# Patient Record
Sex: Male | Born: 1965
Health system: Southern US, Community
[De-identification: ages and names within clinical notes are randomized; demographics above are authoritative.]

## PROBLEM LIST (undated history)

## (undated) DIAGNOSIS — F32A Depression, unspecified: Secondary | ICD-10-CM

## (undated) DIAGNOSIS — F329 Major depressive disorder, single episode, unspecified: Secondary | ICD-10-CM

## (undated) DIAGNOSIS — F419 Anxiety disorder, unspecified: Secondary | ICD-10-CM

## (undated) HISTORY — PX: HERNIA REPAIR: SHX51

## (undated) HISTORY — DX: Depression, unspecified: F32.A

## (undated) HISTORY — DX: Anxiety disorder, unspecified: F41.9

---
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
## 2016-04-01 LAB — HM HEPATITIS C SCREENING LAB: HM Hepatitis Screen: NEGATIVE

## 2016-05-13 ENCOUNTER — Ambulatory Visit (HOSPITAL_COMMUNITY)
Admission: EM | Admit: 2016-05-13 | Discharge: 2016-05-13 | Disposition: A | Discharge status: Home / Self Care | Payer: Worker's Compensation | Attending: Family Medicine | Admitting: Family Medicine

## 2016-05-13 ENCOUNTER — Other Ambulatory Visit: Payer: Self-pay

## 2016-05-13 ENCOUNTER — Encounter (HOSPITAL_COMMUNITY): Payer: Self-pay | Admitting: Emergency Medicine

## 2016-05-13 DIAGNOSIS — R509 Fever, unspecified: Secondary | ICD-10-CM | POA: Diagnosis present

## 2016-05-13 DIAGNOSIS — A7740 Ehrlichiosis, unspecified: Secondary | ICD-10-CM | POA: Insufficient documentation

## 2016-05-13 DIAGNOSIS — F17210 Nicotine dependence, cigarettes, uncomplicated: Secondary | ICD-10-CM | POA: Insufficient documentation

## 2016-05-13 LAB — POCT I-STAT, CHEM 8
BUN: 21 mg/dL — AB (ref 6–20)
CALCIUM ION: 1.08 mmol/L — AB (ref 1.12–1.23)
CHLORIDE: 99 mmol/L — AB (ref 101–111)
CREATININE: 0.8 mg/dL (ref 0.61–1.24)
GLUCOSE: 119 mg/dL — AB (ref 65–99)
HCT: 51 % (ref 39.0–52.0)
Hemoglobin: 17.3 g/dL — ABNORMAL HIGH (ref 13.0–17.0)
Potassium: 4 mmol/L (ref 3.5–5.1)
SODIUM: 135 mmol/L (ref 135–145)
TCO2: 27 mmol/L (ref 0–100)

## 2016-05-13 NOTE — ED Notes (Signed)
The patient presented to the [facility] with a complaint of a fever and pain secondary to a possible insect bite that occurred about a week and a half ago. The patient stated that he went to the city's medical services and was prescribed doxycycline on 05/11/2016. The patient stated that he continues to hurt and spike a fever off and on.

## 2016-05-13 NOTE — Discharge Instructions (Signed)
Ehrlichiosis and Anaplasmosis  Ehrlichiosis and anaplasmosis are diseases people get from ticks. Symptoms often show up 1 week or more after a tick bite. Symptoms may include:  Fever.  Headache.  Chills or shaking.  Tiredness (fatigue).  Muscle pain.  Feeling sick to your stomach (nauseous).  Loss of appetite.  Throwing up (vomiting).  Watery poop (diarrhea). HOME CARE   Take your medicine (antibiotics) as told. Finish it even if you start to feel better.  Avoid ticks by following these rules:  Wear light-colored clothing to help see ticks more easily.  Wear long pants tucked into socks. Wear long-sleeved shirts tucked into pants.  Wear closed shoes or boots.  Use insect repellent. Spray clothes with insect repellent. Follow the directions carefully.  Wear a hat and keep long hair pulled back.  Stay on cleared trails when possible.  Take off your clothes after leaving tick areas. Wash them to get rid of any unseen ticks.  Check yourself, your children, and your pets from head to toe for ticks.  Shower and shampoo after leaving tick areas. GET HELP RIGHT AWAY IF:   You have a fever.  You have a headache.  You feel very tired.  You have muscle pain.  You feel sick to your stomach.  You throw up.  You have watery poop. MAKE SURE YOU:  Understand these instructions.  Will watch your condition.  Will get help right away if you are not doing well or get worse.   This information is not intended to replace advice given to you by your health care provider. Make sure you discuss any questions you have with your health care provider.   Document Released: 10/04/2009 Document Revised: 02/29/2012 Document Reviewed: 07/10/2015 Elsevier Interactive Patient Education [employer].

## 2016-05-14 NOTE — ED Provider Notes (Signed)
CSN: [id number]     Arrival date & time 05/13/16  1713 History   First MD Initiated Contact with Patient 05/13/16 1745     Chief Complaint  Patient presents with  . Insect Bite  . Fever   (Consider location/radiation/quality/duration/timing/severity/associated sxs/prior Treatment) HPI Pt presents with sore throat, body aches, fever, chills for 1-2 days Home treatment has been Doxycycline without much relief of symptoms Fever is improved for short periods of time with OTC antipyretics. Pain score is 4 mostly from coughing and body aches Taking fluids, no appetite  Wife states he has been lying on couch, not much activity.  Has been exposed to ticks, and was seen by city health service.  Denies: CP, SOB, vomiting, diarrhea, rash.    History reviewed. No pertinent past medical history. Past Surgical History  Procedure Laterality Date  . Hernia repair     History reviewed. No pertinent family history. Social History  Substance Use Topics  . Smoking status: Current Every Day Smoker -- 1.00 packs/day for 30 years    Types: Cigarettes  . Smokeless tobacco: None  . Alcohol Use: 2.4 oz/week    4 Cans of beer per week     Comment: Daily    Review of Systems  Denies: HEADACHE, NAUSEA, ABDOMINAL PAIN, CHEST PAIN, CONGESTION, DYSURIA, SHORTNESS OF BREATH  Allergies  Review of patient's allergies indicates no known allergies.  Home Medications   Prior to Admission medications   Medication Sig Start Date End Date Taking? Authorizing Provider  doxycycline (DORYX) 100 MG EC tablet Take by mouth 2 (two) times daily.   Yes Historical Provider, MD   Meds Ordered and Administered this Visit  Medications - No data to display  BP 111/73 mmHg  Pulse 82  Temp(Src) 97.9 F (36.6 C) (Oral)  Resp 18  SpO2 96% No data found.   Physical Exam NURSES NOTES AND VITAL SIGNS REVIEWED. CONSTITUTIONAL: Well developed, well nourished, no acute distress HEENT: normocephalic, atraumatic EYES:  Conjunctiva normal NECK:normal ROM, supple, no adenopathy PULMONARY:No respiratory distress, normal effort ABDOMINAL: Soft, ND, NT BS+, No CVAT MUSCULOSKELETAL: Normal ROM of all extremities,  SKIN: warm and dry without rash PSYCHIATRIC: Mood and affect, behavior are normal   ED Course  Procedures (including critical care time)  Labs Review Labs Reviewed  POCT I-STAT, CHEM 8 - Abnormal; Notable for the following:    Chloride 99 (*)    BUN 21 (*)    Glucose, Bld 119 (*)    Calcium, Ion 1.08 (*)    Hemoglobin 17.3 (*)    All other components within normal limits  B. BURGDORFI ANTIBODIES    Imaging Review No results found.   Visual Acuity Review  Right Eye Distance:   Left Eye Distance:   Bilateral Distance:    Right Eye Near:   Left Eye Near:    Bilateral Near:         MDM   1. Ehrlichiosis, unspecified   Review of blood tests in a couple of days with patient Telephone consultation with Dr. Johnnye Sima from ID, for advice.  Suggested titers for lyme, but most likely Ehrlichiosis, as lyme is not endemic to this area. Pt and wife were happy with this discussion  Patient is reassured that there are no issues that require transfer to higher level of care at this time. Patient is advised to continue home symptomatic treatment. Patient is advised that if there are new or worsening symptoms to attend the emergency department, contact primary care  provider, or return to [facility]. Instructions of care provided discharged home in stable condition.    THIS NOTE WAS GENERATED USING A VOICE RECOGNITION SOFTWARE PROGRAM. ALL REASONABLE EFFORTS  WERE MADE TO PROOFREAD THIS DOCUMENT FOR ACCURACY.  I have verbally reviewed the discharge instructions with the patient. A printed AVS was given to the patient.  All questions were answered prior to discharge.      Konrad Felix, PA 05/14/16 1046

## 2016-05-15 LAB — B. BURGDORFI ANTIBODIES

## 2016-05-18 ENCOUNTER — Telehealth (HOSPITAL_COMMUNITY): Payer: Self-pay | Admitting: Emergency Medicine

## 2016-05-18 NOTE — ED Notes (Signed)
Pt called back Pt ID'd properly... Reports feeling better and sx have subsided  Per Dr. Bridgett Larsson,  Notes Recorded by Melony Overly, MD on 05/15/2016 at 3:51 PM Please notify patient of negative Lyme titers. He should complete course of doxycycline for presumed Ehrlichiosis.  Adv pt if sx are not getting better to return  Pt verb understanding

## 2016-05-18 NOTE — ED Notes (Signed)
LM on pt's VM [phone] Need to give lab results from recent visit on 5/24 Also let pt know labs can be obtained from [facility]  Per Dr. Bridgett Larsson,  Notes Recorded by Melony Overly, MD on 05/15/2016 at 3:51 PM Please notify patient of negative Lyme titers. He should complete course of doxycycline for presumed Ehrlichiosis.

## 2016-05-25 LAB — MISC LABCORP TEST (SEND OUT): Labcorp test code: 138412

## 2016-07-01 LAB — HIV ANTIBODY (ROUTINE TESTING W REFLEX): HIV 1&2 Ab, 4th Generation: NEGATIVE

## 2016-07-03 ENCOUNTER — Ambulatory Visit (INDEPENDENT_AMBULATORY_CARE_PROVIDER_SITE_OTHER): Payer: Commercial Managed Care - HMO | Admitting: Internal Medicine

## 2016-07-03 ENCOUNTER — Encounter: Payer: Self-pay | Admitting: Internal Medicine

## 2016-07-03 VITALS — BP 118/80 | HR 88 | Temp 98.2°F | Resp 16 | Ht 69.0 in | Wt 152.0 lb

## 2016-07-03 DIAGNOSIS — Z13220 Encounter for screening for lipoid disorders: Secondary | ICD-10-CM

## 2016-07-03 DIAGNOSIS — Z125 Encounter for screening for malignant neoplasm of prostate: Secondary | ICD-10-CM

## 2016-07-03 DIAGNOSIS — Z0000 Encounter for general adult medical examination without abnormal findings: Secondary | ICD-10-CM | POA: Diagnosis not present

## 2016-07-03 DIAGNOSIS — Z136 Encounter for screening for cardiovascular disorders: Secondary | ICD-10-CM

## 2016-07-03 DIAGNOSIS — Z131 Encounter for screening for diabetes mellitus: Secondary | ICD-10-CM

## 2016-07-03 DIAGNOSIS — Z130 Encounter for screening for diseases of the blood and blood-forming organs and certain disorders involving the immune mechanism: Secondary | ICD-10-CM

## 2016-07-03 DIAGNOSIS — Z1329 Encounter for screening for other suspected endocrine disorder: Secondary | ICD-10-CM

## 2016-07-03 DIAGNOSIS — Z23 Encounter for immunization: Secondary | ICD-10-CM

## 2016-07-03 DIAGNOSIS — Z1389 Encounter for screening for other disorder: Secondary | ICD-10-CM

## 2016-07-03 DIAGNOSIS — K4090 Unilateral inguinal hernia, without obstruction or gangrene, not specified as recurrent: Secondary | ICD-10-CM

## 2016-07-03 DIAGNOSIS — E559 Vitamin D deficiency, unspecified: Secondary | ICD-10-CM

## 2016-07-03 DIAGNOSIS — Z720 Tobacco use: Secondary | ICD-10-CM

## 2016-07-03 LAB — BASIC METABOLIC PANEL WITH GFR
BUN: 9 mg/dL (ref 7–25)
CALCIUM: 9.4 mg/dL (ref 8.6–10.3)
CO2: 26 mmol/L (ref 20–31)
Chloride: 102 mmol/L (ref 98–110)
Creat: 0.74 mg/dL (ref 0.60–1.35)
GLUCOSE: 97 mg/dL (ref 65–99)
Potassium: 4.2 mmol/L (ref 3.5–5.3)
Sodium: 141 mmol/L (ref 135–146)

## 2016-07-03 LAB — LIPID PANEL
CHOLESTEROL: 211 mg/dL — AB (ref 125–200)
HDL: 69 mg/dL (ref >=40)
LDL Cholesterol: 130 mg/dL — ABNORMAL HIGH (ref <130)
TRIGLYCERIDES: 60 mg/dL (ref <150)
Total CHOL/HDL Ratio: 3.1 Ratio (ref <=5.0)
VLDL: 12 mg/dL (ref <30)

## 2016-07-03 LAB — CBC WITH DIFFERENTIAL/PLATELET
Basophils Absolute: 0 cells/uL (ref 0–200)
Basophils Relative: 0 %
Eosinophils Absolute: 288 cells/uL (ref 15–500)
Eosinophils Relative: 4 %
HEMATOCRIT: 48.2 % (ref 38.5–50.0)
Hemoglobin: 16.6 g/dL (ref 13.2–17.1)
LYMPHS PCT: 31 %
Lymphs Abs: 2232 cells/uL (ref 850–3900)
MCH: 32.4 pg (ref 27.0–33.0)
MCHC: 34.4 g/dL (ref 32.0–36.0)
MCV: 94 fL (ref 80.0–100.0)
MONO ABS: 432 {cells}/uL (ref 200–950)
MONOS PCT: 6 %
MPV: 10.1 fL (ref 7.5–12.5)
NEUTROS PCT: 59 %
Neutro Abs: 4248 cells/uL (ref 1500–7800)
PLATELETS: 140 10*3/uL (ref 140–400)
RBC: 5.13 MIL/uL (ref 4.20–5.80)
RDW: 14.5 % (ref 11.0–15.0)
WBC: 7.2 10*3/uL (ref 3.8–10.8)

## 2016-07-03 LAB — HEPATIC FUNCTION PANEL
ALBUMIN: 4.3 g/dL (ref 3.6–5.1)
ALT: 21 U/L (ref 9–46)
AST: 22 U/L (ref 10–40)
Alkaline Phosphatase: 74 U/L (ref 40–115)
BILIRUBIN INDIRECT: 0.5 mg/dL (ref 0.2–1.2)
Bilirubin, Direct: 0.1 mg/dL (ref <=0.2)
TOTAL PROTEIN: 7.2 g/dL (ref 6.1–8.1)
Total Bilirubin: 0.6 mg/dL (ref 0.2–1.2)

## 2016-07-03 LAB — HEMOGLOBIN A1C
Hgb A1c MFr Bld: 5.4 % (ref <5.7)
Mean Plasma Glucose: 108 mg/dL

## 2016-07-03 LAB — IRON AND TIBC
%SAT: 38 % (ref 15–60)
Iron: 113 ug/dL (ref 50–180)
TIBC: 297 ug/dL (ref 250–425)
UIBC: 184 ug/dL (ref 125–400)

## 2016-07-03 LAB — MAGNESIUM: Magnesium: 2 mg/dL (ref 1.5–2.5)

## 2016-07-03 LAB — VITAMIN B12: Vitamin B-12: 566 pg/mL (ref 200–1100)

## 2016-07-03 LAB — TSH: TSH: 0.97 m[IU]/L (ref 0.40–4.50)

## 2016-07-03 NOTE — Progress Notes (Signed)
Annual Screening Comprehensive Examination and NEW Patient Establishment   This very nice 50 y.o.male presents for complete physical and establishment as a new patient.  Patient has no major health issues.  He is a father to 3 kids.  He works for [employer].  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    Patient reports no complaints at this time.   He has a history of smoking.  He has 30 pack years of smoking.    He has a history of an inguinal hernia repair approximately 10 years ago.  He is getting some bulging on the opposite side.    He is unaware of any tetanus shots in the last 10 years  He has not had his eyes checked recently.  He does see a dentist.    Finally, patient has history of Vitamin D Deficiency and last vitamin D was No results found for: VD25OH.  Currently on supplementation     No current outpatient prescriptions on file prior to visit.   No current facility-administered medications on file prior to visit.    No Known Allergies  No past medical history on file.   There is no immunization history on file for this patient.  Past Surgical History  Procedure Laterality Date  . Hernia repair      No family history on file.  Social History   Social History  . Marital Status: Married    Spouse Name: N/A  . Number of Children: N/A  . Years of Education: N/A   Occupational History  . Not on file.   Social History Main Topics  . Smoking status: Current Every Day Smoker -- 1.00 packs/day for 30 years    Types: Cigarettes  . Smokeless tobacco: Not on file  . Alcohol Use: 2.4 oz/week    4 Cans of beer per week     Comment: Daily  . Drug Use: Not on file  . Sexual Activity: Not on file   Other Topics Concern  . Not on file   Social History Narrative   Review of Systems  HENT: Positive for congestion. Negative for ear pain, sore throat and tinnitus.   Respiratory: Negative for cough, shortness of breath and  wheezing.   Cardiovascular: Negative for chest pain, palpitations and leg swelling.  Gastrointestinal: Positive for diarrhea. Negative for heartburn, abdominal pain, constipation, blood in stool and melena.  Genitourinary: Negative.   Neurological: Negative for dizziness, sensory change, loss of consciousness and headaches.  Psychiatric/Behavioral: Positive for depression. The patient is not nervous/anxious and does not have insomnia.       Physical Exam  BP 118/80 mmHg  Pulse 88  Temp(Src) 98.2 F (36.8 C) (Temporal)  Resp 16  Ht 5\' 9"  (1.753 m)  Wt 152 lb (68.947 kg)  BMI 22.44 kg/m2  General Appearance: Well nourished and in no apparent distress. Eyes: PERRLA, EOMs, conjunctiva no swelling or erythema, normal fundi and vessels. Sinuses: No frontal/maxillary tenderness ENT/Mouth: EACs patent / TMs  nl. Nares clear without erythema, swelling, mucoid exudates. Oral hygiene is good. No erythema, swelling, or exudate. Tongue normal, non-obstructing. Tonsils not swollen or erythematous. Hearing normal.  Neck: Supple, thyroid normal. No bruits, nodes or JVD. Respiratory: Respiratory effort normal.  BS equal and clear bilateral without rales, rhonci, wheezing or stridor. Cardio: Heart sounds are normal with regular rate and rhythm and no murmurs, rubs or gallops. Peripheral pulses are normal and equal bilaterally without edema. No aortic  or femoral bruits. Chest: symmetric with normal excursions and percussion. Abdomen: Flat, soft, with bowl sounds. Nontender, no guarding, rebound, hernias, masses, or organomegaly.  Lymphatics: Non tender without lymphadenopathy.  Genitourinary: Normal male circumcised external genitalia, small inguinal left hernia.  Non-tender to palpation.   Small defect.   Musculoskeletal: Full ROM all peripheral extremities, joint stability, 5/5 strength, and normal gait. Skin: Warm and dry without rashes, lesions, cyanosis, clubbing or  ecchymosis.  Neuro: Cranial  nerves intact, reflexes equal bilaterally. Normal muscle tone, no cerebellar symptoms. Sensation intact.  Pysch: Awake and oriented X 3, normal affect, Insight and Judgment appropriate.   Assessment and Plan   1. Routine general medical examination at a health care facility - CBC with Differential/Platelet - BASIC METABOLIC PANEL WITH GFR - Hepatic function panel - Magnesium  2. Screening for hyperlipidemia  - Lipid panel  3. Screening for diabetes mellitus  - Hemoglobin A1c - Insulin, random  4. Screening for prostate cancer  - PSA  5. Screening for deficiency anemia  - Iron and TIBC - Vitamin B12  6. Screening for hematuria or proteinuria  - Urinalysis, Routine w reflex microscopic (not at [facility]) - Microalbumin / creatinine urine ratio  7. Screening for cardiovascular condition  - EKG 12-Lead  8. Vitamin D deficiency  - VITAMIN D 25 Hydroxy (Vit-D Deficiency, Fractures)  9. Screening for thyroid disorder  - TSH  10. Need for prophylactic vaccination with combined diphtheria-tetanus-pertussis (DTP) vaccine  - Tdap vaccine greater than or equal to 7yo IM  11.  Tobacco abuse -not interested in meds or OTC products -wants to try hypnosis  12.  Inguinal hernia -small defect -watchful waiting -if pain, color change, or blood in stool patient to go to ER    Continue prudent diet as discussed, weight control, regular exercise, and medications. Routine screening labs and tests as requested with regular follow-up as recommended.  Over 40 minutes of exam, counseling, chart review and critical decision making was performed

## 2016-07-04 LAB — MICROALBUMIN / CREATININE URINE RATIO
Creatinine, Urine: 132 mg/dL (ref 20–370)
Microalb Creat Ratio: 2 mcg/mg creat (ref <30)
Microalb, Ur: 0.3 mg/dL

## 2016-07-04 LAB — URINALYSIS, ROUTINE W REFLEX MICROSCOPIC
BILIRUBIN URINE: NEGATIVE
GLUCOSE, UA: NEGATIVE
HGB URINE DIPSTICK: NEGATIVE
KETONES UR: NEGATIVE
Leukocytes, UA: NEGATIVE
Nitrite: NEGATIVE
PH: 8 (ref 5.0–8.0)
Protein, ur: NEGATIVE
SPECIFIC GRAVITY, URINE: 1.016 (ref 1.001–1.035)

## 2016-07-04 LAB — INSULIN, RANDOM: INSULIN: 4.8 u[IU]/mL (ref 2.0–19.6)

## 2016-07-04 LAB — PSA: PSA: 1.43 ng/mL (ref <=4.00)

## 2016-07-04 LAB — VITAMIN D 25 HYDROXY (VIT D DEFICIENCY, FRACTURES): Vit D, 25-Hydroxy: 60 ng/mL (ref 30–100)

## 2017-07-06 ENCOUNTER — Encounter: Payer: Self-pay | Admitting: Internal Medicine

## 2017-08-02 ENCOUNTER — Ambulatory Visit (HOSPITAL_COMMUNITY)
Admission: EM | Admit: 2017-08-02 | Discharge: 2017-08-02 | Disposition: A | Discharge status: Home / Self Care | Payer: 59 | Attending: Family Medicine | Admitting: Family Medicine

## 2017-08-02 ENCOUNTER — Ambulatory Visit (INDEPENDENT_AMBULATORY_CARE_PROVIDER_SITE_OTHER): Payer: 59

## 2017-08-02 ENCOUNTER — Encounter (HOSPITAL_COMMUNITY): Payer: Self-pay | Admitting: Emergency Medicine

## 2017-08-02 DIAGNOSIS — S46911A Strain of unspecified muscle, fascia and tendon at shoulder and upper arm level, right arm, initial encounter: Secondary | ICD-10-CM
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
## 2017-11-07 NOTE — Progress Notes (Deleted)
Complete Physical  Assessment and Plan:  Discussed med's effects and SE's. Screening labs and tests as requested with regular follow-up as recommended. Over 40 minutes of exam, counseling, chart review and critical decision making was performed  HPI Patient presents for a complete physical.   His blood pressure {HAS HAS NOT:18834} been controlled at home, today their BP is   He {DOES_DOES GDJ:24268} workout. He denies chest pain, shortness of breath, dizziness.   He is a father to 3 kids.  He works for [employer].  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  He has a history of smoking.  He has 30 pack years of smoking.    He has a history of an inguinal hernia repair approximately 10 years ago.  He is getting some bulging on the opposite side.    He {ACTION; IS/IS TMH:[id number]} on cholesterol medication and denies myalgias. His cholesterol {ACTION; IS/IS NOT:21021397} at goal. The cholesterol last visit was:   Lab Results  Component Value Date   CHOL 211 (H) 07/03/2016   HDL 69 07/03/2016   LDLCALC 130 (H) 07/03/2016   TRIG 60 07/03/2016   CHOLHDL 3.1 07/03/2016   Last A1C in the office was:  Lab Results  Component Value Date   HGBA1C 5.4 07/03/2016   Patient is on Vitamin D supplement.   Lab Results  Component Value Date   VD25OH 60 07/03/2016     Last PSA was: Lab Results  Component Value Date   PSA 1.43 07/03/2016   BMI is There is no height or weight on file to calculate BMI., he is working on diet and exercise. Wt Readings from Last 3 Encounters:  07/03/16 152 lb (68.9 kg)    Current Medications:  No current outpatient medications on file prior to visit.   No current facility-administered medications on file prior to visit.    Allergies:  No Known Allergies   Health Maintenance:  Immunization History  Administered Date(s) Administered  . Tdap 07/03/2016   Tetanus: 2017 Pneumovax: Prevnar 13: Flu  vaccine: Zostavax:  DEXA: Colonoscopy: DUE EGD: Eye Exam: Dentist:  Patient Care Team: Unk Pinto, MD as PCP - General (Internal Medicine)  Medical History:  does not have a problem list on file. Surgical History:  He  has a past surgical history that includes Hernia repair. Family History:  His family history includes Cancer in his mother; Heart disease in his maternal grandmother; Stroke in his maternal grandmother. Social History:   reports that he has been smoking cigarettes.  He has a 30.00 pack-year smoking history. He does not have any smokeless tobacco history on file. He reports that he drinks about 2.4 oz of alcohol per week. His drug history is not on file.  Review of Systems:  ROS  Physical Exam: Estimated body mass index is 22.45 kg/m as calculated from the following:   Height as of 07/03/16: 5\' 9"  (1.753 m).   Weight as of 07/03/16: 152 lb (68.9 kg). There were no vitals taken for this visit. General Appearance: Well nourished, in no apparent distress.  Eyes: PERRLA, EOMs, conjunctiva no swelling or erythema, normal fundi and vessels.  Sinuses: No Frontal/maxillary tenderness  ENT/Mouth: Ext aud canals clear, normal light reflex with TMs without erythema, bulging. Good dentition. No erythema, swelling, or exudate on post pharynx. Tonsils not swollen or erythematous. Hearing normal.  Neck: Supple, thyroid normal. No bruits  Respiratory: Respiratory effort normal, BS equal bilaterally without rales, rhonchi, wheezing or  stridor.  Cardio: RRR without murmurs, rubs or gallops. Brisk peripheral pulses without edema.  Chest: symmetric, with normal excursions and percussion.  Abdomen: Soft, nontender, no guarding, rebound, hernias, masses, or organomegaly.  Lymphatics: Non tender without lymphadenopathy.  Genitourinary:  Musculoskeletal: Full ROM all peripheral extremities,5/5 strength, and normal gait.  Skin: Warm, dry without rashes, lesions, ecchymosis. Neuro:  Cranial nerves intact, reflexes equal bilaterally. Normal muscle tone, no cerebellar symptoms. Sensation intact.  Psych: Awake and oriented X 3, normal affect, Insight and Judgment appropriate.   EKG: WNL no changes. AORTA SCAN: WNL  Vicie Mutters 3:49 PM [facility]

## 2017-11-08 ENCOUNTER — Encounter: Payer: Self-pay | Admitting: Physician Assistant
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
## 2018-03-03 ENCOUNTER — Encounter: Payer: Self-pay | Admitting: Physician Assistant

## 2018-03-03 ENCOUNTER — Ambulatory Visit: Payer: 59 | Admitting: Physician Assistant

## 2018-03-03 VITALS — BP 120/76 | HR 74 | Temp 97.9°F | Resp 18 | Ht 69.0 in | Wt 168.8 lb

## 2018-03-03 DIAGNOSIS — E291 Testicular hypofunction: Secondary | ICD-10-CM

## 2018-03-03 DIAGNOSIS — Z720 Tobacco use: Secondary | ICD-10-CM

## 2018-03-03 DIAGNOSIS — Z13220 Encounter for screening for lipoid disorders: Secondary | ICD-10-CM

## 2018-03-03 DIAGNOSIS — R197 Diarrhea, unspecified: Secondary | ICD-10-CM

## 2018-03-03 DIAGNOSIS — N529 Male erectile dysfunction, unspecified: Secondary | ICD-10-CM

## 2018-03-03 DIAGNOSIS — I10 Essential (primary) hypertension: Secondary | ICD-10-CM | POA: Diagnosis not present

## 2018-03-03 DIAGNOSIS — Z1329 Encounter for screening for other suspected endocrine disorder: Secondary | ICD-10-CM

## 2018-03-03 DIAGNOSIS — Z136 Encounter for screening for cardiovascular disorders: Secondary | ICD-10-CM | POA: Diagnosis not present

## 2018-03-03 DIAGNOSIS — E559 Vitamin D deficiency, unspecified: Secondary | ICD-10-CM

## 2018-03-03 DIAGNOSIS — Z79899 Other long term (current) drug therapy: Secondary | ICD-10-CM

## 2018-03-03 DIAGNOSIS — Z0000 Encounter for general adult medical examination without abnormal findings: Secondary | ICD-10-CM

## 2018-03-03 DIAGNOSIS — Z1389 Encounter for screening for other disorder: Secondary | ICD-10-CM

## 2018-03-03 DIAGNOSIS — Z6824 Body mass index (BMI) 24.0-24.9, adult: Secondary | ICD-10-CM

## 2018-03-03 NOTE — Patient Instructions (Addendum)
Benefiber or Citracel is good for constipation/diarrhea/irritable bowel syndrome, it helps with weight loss and can help lower your bad cholesterol. Please do 1 TBSP in the morning in water, coffee, or tea. It can take up to a month before you can see a difference with your bowel movements. It is cheapest from [employer], [employer], [employer].   Your LDL could improve, ideally we want it close to 100. Marland Kitchen  Your LDL is the bad cholesterol that can lead to heart attack and stroke. To lower your number you can decrease your fatty foods, red meat, cheese, milk and increase fiber like whole grains and veggies. You can also add a fiber supplement like Citracel or Benefiber, these do not cause gas and bloating and are safe to use.     If you have a smart phone, please look up Smoke Free app, this will help you stay on track and give you information about money you have saved, life that you have gained back and a ton of more information.   ADVANTAGES OF QUITTING SMOKING  Within 20 minutes, blood pressure decreases. Your pulse is at normal level.  After 8 hours, carbon monoxide levels in the blood return to normal. Your oxygen level increases.  After 24 hours, the chance of having a heart attack starts to decrease. Your breath, hair, and body stop smelling like smoke.  After 48 hours, damaged nerve endings begin to recover. Your sense of taste and smell improve.  After 72 hours, the body is virtually free of nicotine. Your bronchial tubes relax and breathing becomes easier.  After 2 to 12 weeks, lungs can hold more air. Exercise becomes easier and circulation improves.  After 1 year, the risk of coronary heart disease is cut in half.  After 5 years, the risk of stroke falls to the same as a nonsmoker.  After 10 years, the risk of lung cancer is cut in half and the risk of other cancers decreases significantly.  After 15 years, the risk of coronary heart disease drops, usually to the level of a  nonsmoker.  You will have extra money to spend on things other than cigarettes.  Can do zinc 40-50 mg a day Can try shake that is whey protein, almond milk, avocado oil, and spinach and strawberries in the morning  9 Ways to Naturally Increase Testosterone Levels  1.   Lose Weight If you're overweight, shedding the excess pounds may increase your testosterone levels, according to research presented at the Endocrine Society's 2012 meeting. Overweight men are more likely to have low testosterone levels to begin with, so this is an important trick to increase your body's testosterone production when you need it most.  2.   High-Intensity Exercise like Peak Fitness  Short intense exercise has a proven positive effect on increasing testosterone levels and preventing its decline. That's unlike aerobics or prolonged moderate exercise, which have shown to have negative or no effect on testosterone levels. Having a whey protein meal after exercise can further enhance the satiety/testosterone-boosting impact (hunger hormones cause the opposite effect on your testosterone and libido). Here's a summary of what a typical high-intensity Peak Fitness routine might look like: " Warm up for three minutes  " Exercise as hard and fast as you can for 30 seconds. You should feel like you couldn't possibly go on another few seconds  " Recover at a slow to moderate pace for 90 seconds  " Repeat the high intensity exercise and recovery 7 more times .  3.   Consume Plenty of Zinc The mineral zinc is important for testosterone production, and supplementing your diet for as little as six weeks has been shown to cause a marked improvement in testosterone among men with low levels.1 Likewise, research has shown that restricting dietary sources of zinc leads to a significant decrease in testosterone, while zinc supplementation increases it2 -- and even protects men from exercised-induced reductions in testosterone  levels.3 It's estimated that up to 7 percent of adults over the age of 60 may have lower than recommended zinc intakes; even when dietary supplements were added in, an estimated 20-25 percent of older adults still had inadequate zinc intakes, according to a [employer] and Nutrition Examination Survey.4 Your diet is the best source of zinc; along with protein-rich foods like meats and fish, other good dietary sources of zinc include raw milk, raw cheese, beans, and yogurt or kefir made from raw milk. It can be difficult to obtain enough dietary zinc if you're a vegetarian, and also for meat-eaters as well, largely because of conventional farming methods that rely heavily on chemical fertilizers and pesticides. These chemicals deplete the soil of nutrients ... nutrients like zinc that must be absorbed by plants in order to be passed on to you. In many cases, you may further deplete the nutrients in your food by the way you prepare it. For most food, cooking it will drastically reduce its levels of nutrients like zinc ... particularly over-cooking, which many people do. If you decide to use a zinc supplement, stick to a dosage of less than 40 mg a day, as this is the recommended adult upper limit. Taking too much zinc can interfere with your body's ability to absorb other minerals, especially copper, and may cause nausea as a side effect.  4.   Strength Training In addition to Peak Fitness, strength training is also known to boost testosterone levels, provided you are doing so intensely enough. When strength training to boost testosterone, you'll want to increase the weight and lower your number of reps, and then focus on exercises that work a large number of muscles, such as dead lifts or squats.  You can "turbo-charge" your weight training by going slower. By slowing down your movement, you're actually turning it into a high-intensity exercise. Super Slow movement allows your muscle, at the microscopic  level, to access the maximum number of cross-bridges between the protein filaments that produce movement in the muscle.   5.   Optimize Your Vitamin D Levels Vitamin D, a steroid hormone, is essential for the healthy development of the nucleus of the sperm cell, and helps maintain semen quality and sperm count. Vitamin D also increases levels of testosterone, which may boost libido. In one study, overweight men who were given vitamin D supplements had a significant increase in testosterone levels after one year.5   6.   Reduce Stress When you're under a lot of stress, your body releases high levels of the stress hormone cortisol. This hormone actually blocks the effects of testosterone,6 presumably because, from a biological standpoint, testosterone-associated behaviors (mating, competing, aggression) may have lowered your chances of survival in an emergency (hence, the "fight or flight" response is dominant, courtesy of cortisol).  7.   Limit or Eliminate Sugar from Your Diet Testosterone levels decrease after you eat sugar, which is likely because the sugar leads to a high insulin level, another factor leading to low testosterone.7 Based on USDA estimates, the average American consumes 12 teaspoons of  sugar a day, which equates to about TWO TONS of sugar during a lifetime.  8.   Eat Healthy Fats By healthy, this means not only mon- and polyunsaturated fats, like that found in avocadoes and nuts, but also saturated, as these are essential for building testosterone. Research shows that a diet with less than 40 percent of energy as fat (and that mainly from animal sources, i.e. saturated) lead to a decrease in testosterone levels.8 My personal diet is about 60-70 percent healthy fat, and other experts agree that the ideal diet includes somewhere between 50-70 percent fat.  It's important to understand that your body requires saturated fats from animal and vegetable sources (such as meat, dairy, certain  oils, and tropical plants like coconut) for optimal functioning, and if you neglect this important food group in favor of sugar, grains and other starchy carbs, your health and weight are almost guaranteed to suffer. Examples of healthy fats you can eat more of to give your testosterone levels a boost include: Olives and Olive oil  Coconuts and coconut oil Butter made from raw grass-fed organic milk Raw nuts, such as, almonds or pecans Organic pastured egg yolks Avocados Grass-fed meats Palm oil Unheated organic nut oils   9.   Boost Your Intake of Branch Chain Amino Acids (BCAA) from Foods Like [place] suggests that BCAAs result in higher testosterone levels, particularly when taken along with resistance training.9 While BCAAs are available in supplement form, you'll find the highest concentrations of BCAAs like leucine in dairy products - especially quality cheeses and whey protein. Even when getting leucine from your natural food supply, it's often wasted or used as a building block instead of an anabolic agent. So to create the correct anabolic environment, you need to boost leucine consumption way beyond mere maintenance levels. That said, keep in mind that using leucine as a free form amino acid can be highly counterproductive as when free form amino acids are artificially administrated, they rapidly enter your circulation while disrupting insulin function, and impairing your body's glycemic control. Food-based leucine is really the ideal form that can benefit your muscles without side effects.

## 2018-03-05 LAB — LIPID PANEL
CHOL/HDL RATIO: 2.8 (calc) (ref <5.0)
Cholesterol: 180 mg/dL (ref <200)
HDL: 64 mg/dL (ref >40)
LDL Cholesterol (Calc): 97 mg/dL (calc)
NON-HDL CHOLESTEROL (CALC): 116 mg/dL (ref <130)
TRIGLYCERIDES: 98 mg/dL (ref <150)

## 2018-03-05 LAB — BASIC METABOLIC PANEL WITH GFR
BUN: 8 mg/dL (ref 7–25)
CO2: 30 mmol/L (ref 20–32)
CREATININE: 0.74 mg/dL (ref 0.60–1.35)
Calcium: 9 mg/dL (ref 8.6–10.3)
Chloride: 102 mmol/L (ref 98–110)
GFR, Est African American: 126 mL/min/{1.73_m2} (ref >=60)
GFR, Est Non African American: 108 mL/min/{1.73_m2} (ref >=60)
GLUCOSE: 91 mg/dL (ref 65–99)
Potassium: 4.3 mmol/L (ref 3.5–5.3)
Sodium: 138 mmol/L (ref 135–146)

## 2018-03-05 LAB — HEPATIC FUNCTION PANEL
AG RATIO: 1.8 (calc) (ref 1.0–2.5)
ALT: 36 U/L (ref 9–46)
AST: 26 U/L (ref 10–40)
Albumin: 4.2 g/dL (ref 3.6–5.1)
Alkaline phosphatase (APISO): 81 U/L (ref 40–115)
BILIRUBIN INDIRECT: 0.3 mg/dL (ref 0.2–1.2)
Bilirubin, Direct: 0.1 mg/dL (ref 0.0–0.2)
GLOBULIN: 2.4 g/dL (ref 1.9–3.7)
TOTAL PROTEIN: 6.6 g/dL (ref 6.1–8.1)
Total Bilirubin: 0.4 mg/dL (ref 0.2–1.2)

## 2018-03-05 LAB — MICROALBUMIN / CREATININE URINE RATIO
Creatinine, Urine: 17 mg/dL — ABNORMAL LOW (ref 20–320)
MICROALB UR: 0.2 mg/dL
Microalb Creat Ratio: 12 mcg/mg creat (ref <30)

## 2018-03-05 LAB — CBC WITH DIFFERENTIAL/PLATELET
BASOS PCT: 0.4 %
Basophils Absolute: 31 cells/uL (ref 0–200)
EOS ABS: 146 {cells}/uL (ref 15–500)
Eosinophils Relative: 1.9 %
HEMATOCRIT: 44.5 % (ref 38.5–50.0)
HEMOGLOBIN: 15.4 g/dL (ref 13.2–17.1)
LYMPHS ABS: 2349 {cells}/uL (ref 850–3900)
MCH: 32.7 pg (ref 27.0–33.0)
MCHC: 34.6 g/dL (ref 32.0–36.0)
MCV: 94.5 fL (ref 80.0–100.0)
MONOS PCT: 6.3 %
MPV: 10.5 fL (ref 7.5–12.5)
NEUTROS ABS: 4689 {cells}/uL (ref 1500–7800)
Neutrophils Relative %: 60.9 %
Platelets: 156 10*3/uL (ref 140–400)
RBC: 4.71 10*6/uL (ref 4.20–5.80)
RDW: 12.5 % (ref 11.0–15.0)
Total Lymphocyte: 30.5 %
WBC: 7.7 10*3/uL (ref 3.8–10.8)
WBCMIX: 485 {cells}/uL (ref 200–950)

## 2018-03-05 LAB — TESTOSTERONE: Testosterone: 319 ng/dL (ref 250–827)

## 2018-03-05 LAB — URINALYSIS, ROUTINE W REFLEX MICROSCOPIC
BILIRUBIN URINE: NEGATIVE
GLUCOSE, UA: NEGATIVE
Hgb urine dipstick: NEGATIVE
KETONES UR: NEGATIVE
Leukocytes, UA: NEGATIVE
Nitrite: NEGATIVE
PH: 8 (ref 5.0–8.0)
Protein, ur: NEGATIVE
Specific Gravity, Urine: 1.003 (ref 1.001–1.03)

## 2018-03-05 LAB — CELIAC DISEASE COMPREHENSIVE PANEL WITH REFLEXES
(tTG) Ab, IgA: 1 U/mL
Immunoglobulin A: 291 mg/dL (ref 81–463)

## 2018-03-05 LAB — TSH: TSH: 1.06 mIU/L (ref 0.40–4.50)

## 2018-03-05 LAB — VITAMIN D 25 HYDROXY (VIT D DEFICIENCY, FRACTURES): Vit D, 25-Hydroxy: 91 ng/mL (ref 30–100)

## 2018-06-24 ENCOUNTER — Encounter: Payer: Self-pay | Admitting: Physician Assistant

## 2018-11-24 ENCOUNTER — Encounter: Payer: Self-pay | Admitting: Physician Assistant
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
## 2019-03-08 ENCOUNTER — Encounter: Payer: Self-pay | Admitting: Physician Assistant
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
## 2019-07-06 ENCOUNTER — Other Ambulatory Visit: Payer: Self-pay

## 2019-07-06 ENCOUNTER — Ambulatory Visit: Payer: 59 | Admitting: Physician Assistant

## 2019-07-06 ENCOUNTER — Encounter: Payer: Self-pay | Admitting: Physician Assistant

## 2019-07-06 ENCOUNTER — Encounter: Payer: Self-pay | Admitting: Gastroenterology

## 2019-07-06 VITALS — BP 118/76 | HR 87 | Temp 97.9°F | Wt 152.4 lb

## 2019-07-06 DIAGNOSIS — Z0000 Encounter for general adult medical examination without abnormal findings: Secondary | ICD-10-CM

## 2019-07-06 DIAGNOSIS — Z13220 Encounter for screening for lipoid disorders: Secondary | ICD-10-CM

## 2019-07-06 DIAGNOSIS — Z0001 Encounter for general adult medical examination with abnormal findings: Secondary | ICD-10-CM

## 2019-07-06 DIAGNOSIS — Z131 Encounter for screening for diabetes mellitus: Secondary | ICD-10-CM

## 2019-07-06 DIAGNOSIS — Z720 Tobacco use: Secondary | ICD-10-CM

## 2019-07-06 DIAGNOSIS — Z1389 Encounter for screening for other disorder: Secondary | ICD-10-CM

## 2019-07-06 DIAGNOSIS — R5383 Other fatigue: Secondary | ICD-10-CM

## 2019-07-06 DIAGNOSIS — I10 Essential (primary) hypertension: Secondary | ICD-10-CM

## 2019-07-06 DIAGNOSIS — Z130 Encounter for screening for diseases of the blood and blood-forming organs and certain disorders involving the immune mechanism: Secondary | ICD-10-CM

## 2019-07-06 DIAGNOSIS — N529 Male erectile dysfunction, unspecified: Secondary | ICD-10-CM

## 2019-07-06 DIAGNOSIS — Z1329 Encounter for screening for other suspected endocrine disorder: Secondary | ICD-10-CM

## 2019-07-06 DIAGNOSIS — Z79899 Other long term (current) drug therapy: Secondary | ICD-10-CM

## 2019-07-06 DIAGNOSIS — E559 Vitamin D deficiency, unspecified: Secondary | ICD-10-CM

## 2019-07-06 DIAGNOSIS — Z1211 Encounter for screening for malignant neoplasm of colon: Secondary | ICD-10-CM

## 2019-07-06 DIAGNOSIS — Z136 Encounter for screening for cardiovascular disorders: Secondary | ICD-10-CM | POA: Diagnosis not present

## 2019-07-06 DIAGNOSIS — S20462A Insect bite (nonvenomous) of left back wall of thorax, initial encounter: Secondary | ICD-10-CM

## 2019-07-06 DIAGNOSIS — F3341 Major depressive disorder, recurrent, in partial remission: Secondary | ICD-10-CM

## 2019-07-06 MED ORDER — FLUOXETINE HCL 20 MG PO CAPS: 20.0000 mg | ORAL_CAPSULE | Freq: Every day | ORAL | 2 refills | Status: DC

## 2019-07-06 NOTE — Patient Instructions (Addendum)
INFORMATION ABOUT YOUR XRAY  Can walk into [address] for an Insurance account manager. They will have the order and take you back. You do not any paper work, I should get the result back today or tomorrow. This order is good for a year.  Can call [phone] to schedule an appointment if you wish.   Going to refer for colonoscopy  Colon cancer is 3rd most diagnosed cancer and 2nd leading cause of death in both men and women 51 years of age and older despite being one of the most preventable and treatable cancers if found early.  4 of out 5 people diagnosed with colon cancer have NO prior family history.  When caught EARLY 90% of colon cancer is curable.   Counseling services  Here are some numbers below you can try but I suggest calling your insurance and finding out who is in your network and THEN calling those people or looking them up on google.   I'm a big fan of Cognitive Behavioral Therapy, look this up on You tube or check with the therapist you see if they are certified.  This form of therapy helps to teach you skills to better handle with current situation that are causing anxiety or depression.   There are some great apps too Check out [place], give thanks app.  Meditations apps are great like headspace.    Will try prozac first and see how you do  We are starting you on a new medication. Here is some general information.   1) Medications are not always the solution, any medication we put you on there is always a hope to come off of it depending on the medication. For example, If we start you on a hypertension medication, I would love to get you off of it and we can address that every visit if you wish. I'm always willing to try to get you off a medication unless I really feel that it is beneficial for you.   2) With what I mentioned above, there is no magic pill, I need you to put in the work to get off any medication you wish to not be on. So things to help is move a little each  day, drink plenty of water, eat veggies/fruit, and don't smoke.   3) Every medication has a potential for a side effect. Even over the counter medications have a potential side effect. So I start you on a medication and there is something different over the next 1-3 months let me know. It is always possible that it can be the medication.   Here is some information below about your new medication.  If you have any concerns or questions please contact the office and not Dr. Essie Hart. =) Remember also that during a study ANY symptoms someone has can be listed as a side effect even if it was not caused by the medication.     SMOKING CESSATION  American cancer society  [phone] for more information or for a free program for smoking cessation help.   You can call QUIT SMART 1-800-QUIT-NOW for free nicotine patches or replacement therapy- if they are out- keep calling  [facility] [place] [facility] Can call for smoking cessation classes, [phone]  If you have a smart phone, please look up Smoke Free app, this will help you stay on track and give you information about money you have saved, life that you have gained back and a ton of more information.  ADVANTAGES OF QUITTING SMOKING  Within 20 minutes, blood pressure decreases. Your pulse is at normal level.  After 8 hours, carbon monoxide levels in the blood return to normal. Your oxygen level increases.  After 24 hours, the chance of having a heart attack starts to decrease. Your breath, hair, and body stop smelling like smoke.  After 48 hours, damaged nerve endings begin to recover. Your sense of taste and smell improve.  After 72 hours, the body is virtually free of nicotine. Your bronchial tubes relax and breathing becomes easier.  After 2 to 12 weeks, lungs can hold more air. Exercise becomes easier and circulation improves.  After 1 year, the risk of coronary heart disease is cut in half.  After 5 years, the risk of stroke  falls to the same as a nonsmoker.  After 10 years, the risk of lung cancer is cut in half and the risk of other cancers decreases significantly.  After 15 years, the risk of coronary heart disease drops, usually to the level of a nonsmoker.  You will have extra money to spend on things other than cigarettes.   Can do zinc 40-50 mg a day Can try shake that is whey protein, almond milk, avocado oil, and spinach and strawberries in the morning  9 Ways to Naturally Increase Testosterone Levels  1.   Lose Weight If you're overweight, shedding the excess pounds may increase your testosterone levels, according to research presented at the Endocrine Society's 2012 meeting. Overweight men are more likely to have low testosterone levels to begin with, so this is an important trick to increase your body's testosterone production when you need it most.  2.   High-Intensity Exercise like Peak Fitness  Short intense exercise has a proven positive effect on increasing testosterone levels and preventing its decline. That's unlike aerobics or prolonged moderate exercise, which have shown to have negative or no effect on testosterone levels. Having a whey protein meal after exercise can further enhance the satiety/testosterone-boosting impact (hunger hormones cause the opposite effect on your testosterone and libido). Here's a summary of what a typical high-intensity Peak Fitness routine might look like: " Warm up for three minutes  " Exercise as hard and fast as you can for 30 seconds. You should feel like you couldn't possibly go on another few seconds  " Recover at a slow to moderate pace for 90 seconds  " Repeat the high intensity exercise and recovery 7 more times .  3.   Consume Plenty of Zinc The mineral zinc is important for testosterone production, and supplementing your diet for as little as six weeks has been shown to cause a marked improvement in testosterone among men with low levels.1 Likewise,  research has shown that restricting dietary sources of zinc leads to a significant decrease in testosterone, while zinc supplementation increases it2 -- and even protects men from exercised-induced reductions in testosterone levels.3 It's estimated that up to 78 percent of adults over the age of 60 may have lower than recommended zinc intakes; even when dietary supplements were added in, an estimated 20-25 percent of older adults still had inadequate zinc intakes, according to a [employer] and Nutrition Examination Survey.4 Your diet is the best source of zinc; along with protein-rich foods like meats and fish, other good dietary sources of zinc include raw milk, raw cheese, beans, and yogurt or kefir made from raw milk. It can be difficult to obtain enough dietary zinc if you're a vegetarian, and also for  meat-eaters as well, largely because of conventional farming methods that rely heavily on chemical fertilizers and pesticides. These chemicals deplete the soil of nutrients ... nutrients like zinc that must be absorbed by plants in order to be passed on to you. In many cases, you may further deplete the nutrients in your food by the way you prepare it. For most food, cooking it will drastically reduce its levels of nutrients like zinc ... particularly over-cooking, which many people do. If you decide to use a zinc supplement, stick to a dosage of less than 40 mg a day, as this is the recommended adult upper limit. Taking too much zinc can interfere with your body's ability to absorb other minerals, especially copper, and may cause nausea as a side effect.  4.   Strength Training In addition to Peak Fitness, strength training is also known to boost testosterone levels, provided you are doing so intensely enough. When strength training to boost testosterone, you'll want to increase the weight and lower your number of reps, and then focus on exercises that work a large number of muscles, such as dead  lifts or squats.  You can "turbo-charge" your weight training by going slower. By slowing down your movement, you're actually turning it into a high-intensity exercise. Super Slow movement allows your muscle, at the microscopic level, to access the maximum number of cross-bridges between the protein filaments that produce movement in the muscle.   5.   Optimize Your Vitamin D Levels Vitamin D, a steroid hormone, is essential for the healthy development of the nucleus of the sperm cell, and helps maintain semen quality and sperm count. Vitamin D also increases levels of testosterone, which may boost libido. In one study, overweight men who were given vitamin D supplements had a significant increase in testosterone levels after one year.5   6.   Reduce Stress When you're under a lot of stress, your body releases high levels of the stress hormone cortisol. This hormone actually blocks the effects of testosterone,6 presumably because, from a biological standpoint, testosterone-associated behaviors (mating, competing, aggression) may have lowered your chances of survival in an emergency (hence, the "fight or flight" response is dominant, courtesy of cortisol).  7.   Limit or Eliminate Sugar from Your Diet Testosterone levels decrease after you eat sugar, which is likely because the sugar leads to a high insulin level, another factor leading to low testosterone.7 Based on USDA estimates, the average American consumes 12 teaspoons of sugar a day, which equates to about TWO TONS of sugar during a lifetime.  8.   Eat Healthy Fats By healthy, this means not only mon- and polyunsaturated fats, like that found in avocadoes and nuts, but also saturated, as these are essential for building testosterone. Research shows that a diet with less than 40 percent of energy as fat (and that mainly from animal sources, i.e. saturated) lead to a decrease in testosterone levels.8 My personal diet is about 60-70 percent healthy  fat, and other experts agree that the ideal diet includes somewhere between 50-70 percent fat.  It's important to understand that your body requires saturated fats from animal and vegetable sources (such as meat, dairy, certain oils, and tropical plants like coconut) for optimal functioning, and if you neglect this important food group in favor of sugar, grains and other starchy carbs, your health and weight are almost guaranteed to suffer. Examples of healthy fats you can eat more of to give your testosterone levels a boost include: Quentin Cornwall  and Olive oil  Coconuts and coconut oil Butter made from raw grass-fed organic milk Raw nuts, such as, almonds or pecans Organic pastured egg yolks Avocados Grass-fed meats Palm oil Unheated organic nut oils   9.   Boost Your Intake of Branch Chain Amino Acids (BCAA) from Foods Like [place] suggests that BCAAs result in higher testosterone levels, particularly when taken along with resistance training.9 While BCAAs are available in supplement form, you'll find the highest concentrations of BCAAs like leucine in dairy products - especially quality cheeses and whey protein. Even when getting leucine from your natural food supply, it's often wasted or used as a building block instead of an anabolic agent. So to create the correct anabolic environment, you need to boost leucine consumption way beyond mere maintenance levels. That said, keep in mind that using leucine as a free form amino acid can be highly counterproductive as when free form amino acids are artificially administrated, they rapidly enter your circulation while disrupting insulin function, and impairing your body's glycemic control. Food-based leucine is really the ideal form that can benefit your muscles without side effects.   GENERAL HEALTH GOALS  Know what a healthy weight is for you (roughly BMI <25) and aim to maintain this  Aim for 7+ servings of fruits and vegetables daily  70-80+  fluid ounces of water or unsweet tea for healthy kidneys  Limit to max 1 drink of alcohol per day; avoid smoking/tobacco  Limit animal fats in diet for cholesterol and heart health - choose grass fed whenever available  Avoid highly processed foods, and foods high in saturated/trans fats  Aim for low stress - take time to unwind and care for your mental health  Aim for 150 min of moderate intensity exercise weekly for heart health, and weights twice weekly for bone health  Aim for 7-9 hours of sleep daily

## 2019-07-07 LAB — MICROALBUMIN / CREATININE URINE RATIO
Creatinine, Urine: 219 mg/dL (ref 20–320)
Microalb Creat Ratio: 8 mcg/mg creat (ref <30)
Microalb, Ur: 1.7 mg/dL

## 2019-07-07 LAB — CBC WITH DIFFERENTIAL/PLATELET
Absolute Monocytes: 455 cells/uL (ref 200–950)
Basophils Absolute: 20 cells/uL (ref 0–200)
Basophils Relative: 0.3 %
Eosinophils Absolute: 79 cells/uL (ref 15–500)
Eosinophils Relative: 1.2 %
HCT: 50.4 % — ABNORMAL HIGH (ref 38.5–50.0)
Hemoglobin: 17.4 g/dL — ABNORMAL HIGH (ref 13.2–17.1)
Lymphs Abs: 1551 cells/uL (ref 850–3900)
MCH: 33.7 pg — ABNORMAL HIGH (ref 27.0–33.0)
MCHC: 34.5 g/dL (ref 32.0–36.0)
MCV: 97.7 fL (ref 80.0–100.0)
MPV: 10.5 fL (ref 7.5–12.5)
Monocytes Relative: 6.9 %
Neutro Abs: 4495 cells/uL (ref 1500–7800)
Neutrophils Relative %: 68.1 %
Platelets: 164 10*3/uL (ref 140–400)
RBC: 5.16 10*6/uL (ref 4.20–5.80)
RDW: 13.1 % (ref 11.0–15.0)
Total Lymphocyte: 23.5 %
WBC: 6.6 10*3/uL (ref 3.8–10.8)

## 2019-07-07 LAB — IRON,?TOTAL/TOTAL IRON BINDING CAP
%SAT: 45 % (calc) (ref 20–48)
Iron: 125 ug/dL (ref 50–180)

## 2019-07-07 LAB — INSULIN, RANDOM: Insulin: 5.6 u[IU]/mL

## 2019-07-07 LAB — COMPLETE METABOLIC PANEL WITH GFR
AG Ratio: 1.7 (calc) (ref 1.0–2.5)
ALT: 26 U/L (ref 9–46)
AST: 21 U/L (ref 10–35)
Albumin: 4.6 g/dL (ref 3.6–5.1)
Alkaline phosphatase (APISO): 77 U/L (ref 35–144)
BUN: 7 mg/dL (ref 7–25)
CO2: 26 mmol/L (ref 20–32)
Calcium: 9.6 mg/dL (ref 8.6–10.3)
Chloride: 103 mmol/L (ref 98–110)
Creat: 0.72 mg/dL (ref 0.70–1.33)
GFR, Est African American: 126 mL/min/{1.73_m2} (ref >=60)
GFR, Est Non African American: 109 mL/min/{1.73_m2} (ref >=60)
Globulin: 2.7 g/dL (calc) (ref 1.9–3.7)
Glucose, Bld: 92 mg/dL (ref 65–99)
Potassium: 4.4 mmol/L (ref 3.5–5.3)
Sodium: 138 mmol/L (ref 135–146)
Total Bilirubin: 0.6 mg/dL (ref 0.2–1.2)
Total Protein: 7.3 g/dL (ref 6.1–8.1)

## 2019-07-07 LAB — LIPID PANEL
Cholesterol: 219 mg/dL — ABNORMAL HIGH (ref <200)
HDL: 61 mg/dL (ref >=40)
LDL Cholesterol (Calc): 133 mg/dL (calc) — ABNORMAL HIGH
Non-HDL Cholesterol (Calc): 158 mg/dL (calc) — ABNORMAL HIGH (ref <130)
Total CHOL/HDL Ratio: 3.6 (calc) (ref <5.0)
Triglycerides: 142 mg/dL (ref <150)

## 2019-07-07 LAB — URINALYSIS, ROUTINE W REFLEX MICROSCOPIC
Bilirubin Urine: NEGATIVE
Glucose, UA: NEGATIVE
Hgb urine dipstick: NEGATIVE
Ketones, ur: NEGATIVE
Leukocytes,Ua: NEGATIVE
Nitrite: NEGATIVE
Protein, ur: NEGATIVE
Specific Gravity, Urine: 1.019 (ref 1.001–1.03)
pH: 6.5 (ref 5.0–8.0)

## 2019-07-07 LAB — TESTOSTERONE: Testosterone: 486 ng/dL (ref 250–827)

## 2019-07-07 LAB — PSA: PSA: 1 ng/mL (ref <=4.0)

## 2019-07-07 LAB — HEMOGLOBIN A1C
Hgb A1c MFr Bld: 5.4 % of total Hgb (ref <5.7)
Mean Plasma Glucose: 108 (calc)
eAG (mmol/L): 6 (calc)

## 2019-07-07 LAB — IRON, TOTAL/TOTAL IRON BINDING CAP: TIBC: 280 mcg/dL (calc) (ref 250–425)

## 2019-07-07 LAB — VITAMIN B12: Vitamin B-12: 439 pg/mL (ref 200–1100)

## 2019-07-07 LAB — B. BURGDORFI ANTIBODIES: B burgdorferi Ab IgG+IgM: <0.9 index

## 2019-07-07 LAB — MAGNESIUM: Magnesium: 2.1 mg/dL (ref 1.5–2.5)

## 2019-07-07 LAB — VITAMIN D 25 HYDROXY (VIT D DEFICIENCY, FRACTURES): Vit D, 25-Hydroxy: 46 ng/mL (ref 30–100)

## 2019-07-07 LAB — TSH: TSH: 0.88 mIU/L (ref 0.40–4.50)

## 2019-07-28 ENCOUNTER — Other Ambulatory Visit: Payer: Self-pay

## 2019-07-28 ENCOUNTER — Ambulatory Visit: Payer: 59 | Admitting: *Deleted

## 2019-07-28 VITALS — Ht 69.0 in | Wt 150.0 lb

## 2019-07-28 DIAGNOSIS — Z1211 Encounter for screening for malignant neoplasm of colon: Secondary | ICD-10-CM

## 2019-07-28 MED ORDER — SUPREP BOWEL PREP KIT 17.5-3.13-1.6 GM/177ML PO SOLN: 1.0000 | Freq: Once | ORAL | 0 refills | Status: AC

## 2019-07-28 NOTE — Progress Notes (Signed)
Previsit via telephone related to COVID pandemic ID per name, address and DOB   No egg or soy allergy known to patient  No issues with past sedation with any surgeries  or procedures, no intubation problems  No diet pills per patient No home 02 use per patient  No blood thinners per patient  Pt denies issues with constipation  No A fib or A flutter  EMMI information,consent, acknowledgement form with stamped envelope for return, Suprep coupon and Instructions. Patient aware and verbalized understanding.

## 2019-08-01 ENCOUNTER — Encounter: Payer: Self-pay | Admitting: Gastroenterology

## 2019-08-03 ENCOUNTER — Telehealth: Payer: Self-pay | Admitting: Gastroenterology

## 2019-08-03 NOTE — Telephone Encounter (Signed)

## 2019-08-04 ENCOUNTER — Encounter: Payer: Self-pay | Admitting: Gastroenterology

## 2019-08-04 ENCOUNTER — Ambulatory Visit (AMBULATORY_SURGERY_CENTER): Payer: 59 | Admitting: Gastroenterology

## 2019-08-04 ENCOUNTER — Other Ambulatory Visit: Payer: Self-pay

## 2019-08-04 VITALS — BP 98/68 | HR 75 | Temp 98.6°F | Resp 11 | Ht 69.0 in | Wt 150.0 lb

## 2019-08-04 DIAGNOSIS — D122 Benign neoplasm of ascending colon: Secondary | ICD-10-CM

## 2019-08-04 DIAGNOSIS — Z1211 Encounter for screening for malignant neoplasm of colon: Secondary | ICD-10-CM

## 2019-08-04 DIAGNOSIS — D128 Benign neoplasm of rectum: Secondary | ICD-10-CM

## 2019-08-04 MED ORDER — SODIUM CHLORIDE 0.9 % IV SOLN: 500.0000 mL | Freq: Once | INTRAVENOUS | Status: DC

## 2019-08-04 NOTE — Progress Notes (Signed)
Report given to PACU, vss 

## 2019-08-04 NOTE — Op Note (Signed)
[facility] Patient Name: Robert Caldwell Procedure Date: 08/04/2019 11:24 AM MRN: [id number] Endoscopist: [place]. Loletha Carrow , MD Age: 53 Referring MD:  Date of Birth: 27-Dec-1965 Gender: Male Account #: [id number] Procedure:                Colonoscopy Indications:              Screening for colorectal malignant neoplasm, This                            is the patient's first colonoscopy Medicines:                Monitored Anesthesia Care Procedure:                Pre-Anesthesia Assessment:                           - Prior to the procedure, a History and Physical                            was performed, and patient medications and                            allergies were reviewed. The patient's tolerance of                            previous anesthesia was also reviewed. The risks                            and benefits of the procedure and the sedation                            options and risks were discussed with the patient.                            All questions were answered, and informed consent                            was obtained. Prior Anticoagulants: The patient has                            taken no previous anticoagulant or antiplatelet                            agents. ASA Grade Assessment: II - A patient with                            mild systemic disease. After reviewing the risks                            and benefits, the patient was deemed in                            satisfactory condition to undergo the procedure.  After obtaining informed consent, the colonoscope                            was passed under direct vision. Throughout the                            procedure, the patient's blood pressure, pulse, and                            oxygen saturations were monitored continuously. The                            Colonoscope was introduced through the anus and                            advanced to the the cecum,  identified by                            appendiceal orifice and ileocecal valve. The                            colonoscopy was performed without difficulty. The                            patient tolerated the procedure well. The quality                            of the bowel preparation was good. The ileocecal                            valve, appendiceal orifice, and rectum were                            photographed. The quality of the bowel preparation                            was evaluated using the BBPS [facility] Bowel                            Preparation Scale) with scores of: Right Colon = 2,                            Transverse Colon = 2 and Left Colon = 2. The total                            BBPS score equals 6. Scope In: 11:32:23 AM Scope Out: 11:50:31 AM Scope Withdrawal Time: 0 hours 16 minutes 8 seconds  Total Procedure Duration: 0 hours 18 minutes 8 seconds  Findings:                 The perianal and digital rectal examinations were                            normal.  Four sessile polyps were found in the ascending                            colon. The polyps were 3 to 8 mm in size. These                            polyps were removed with a cold snare. Resection                            and retrieval were complete.                           A 3 mm polyp was found in the rectum. The polyp was                            sessile. The polyp was removed with a cold snare.                            Resection and retrieval were complete.                           The exam was otherwise without abnormality on                            direct and retroflexion views. Complications:            No immediate complications. Estimated Blood Loss:     Estimated blood loss was minimal. Impression:               - Four 3 to 8 mm polyps in the ascending colon,                            removed with a cold snare. Resected and retrieved.                            - One 3 mm polyp in the rectum, removed with a cold                            snare. Resected and retrieved.                           - The examination was otherwise normal on direct                            and retroflexion views. Recommendation:           - Patient has a contact number available for                            emergencies. The signs and symptoms of potential                            delayed complications were discussed with the  patient. Return to normal activities tomorrow.                            Written discharge instructions were provided to the                            patient.                           - Resume previous diet.                           - Continue present medications.                           - Await pathology results.                           - Repeat colonoscopy is recommended for                            surveillance. The colonoscopy date will be                            determined after pathology results from today's                            exam become available for review. Landy Dunnavant L. Loletha Carrow, MD 08/04/2019 11:57:16 AM This report has been signed electronically.

## 2019-08-04 NOTE — Progress Notes (Signed)
Called to room to assist during endoscopic procedure.  Patient ID and intended procedure confirmed with present staff. Received instructions for my participation in the procedure from the performing physician.  

## 2019-08-04 NOTE — Patient Instructions (Signed)
YOU HAD AN ENDOSCOPIC PROCEDURE TODAY AT [facility]:   Refer to the procedure report that was given to you for any specific questions about what was found during the examination.  If the procedure report does not answer your questions, please call your gastroenterologist to clarify.  If you requested that your care partner not be given the details of your procedure findings, then the procedure report has been included in a sealed envelope for you to review at your convenience later.  YOU SHOULD EXPECT: Some feelings of bloating in the abdomen. Passage of more gas than usual.  Walking can help get rid of the air that was put into your GI tract during the procedure and reduce the bloating. If you had a lower endoscopy (such as a colonoscopy or flexible sigmoidoscopy) you may notice spotting of blood in your stool or on the toilet paper. If you underwent a bowel prep for your procedure, you may not have a normal bowel movement for a few days.  Please Note:  You might notice some irritation and congestion in your nose or some drainage.  This is from the oxygen used during your procedure.  There is no need for concern and it should clear up in a day or so.  SYMPTOMS TO REPORT IMMEDIATELY:   Following lower endoscopy (colonoscopy or flexible sigmoidoscopy):  Excessive amounts of blood in the stool  Significant tenderness or worsening of abdominal pains  Swelling of the abdomen that is new, acute  Fever of 100F or higher  For urgent or emergent issues, a gastroenterologist can be reached at any hour by calling [phone].  DIET:  We do recommend a small meal at first, but then you may proceed to your regular diet.  Drink plenty of fluids but you should avoid alcoholic beverages for 24 hours.  ACTIVITY:  You should plan to take it easy for the rest of today and you should NOT DRIVE or use heavy machinery until tomorrow (because of the sedation medicines used during the test).     FOLLOW UP: Our staff will call the number listed on your records 48-72 hours following your procedure to check on you and address any questions or concerns that you may have regarding the information given to you following your procedure. If we do not reach you, we will leave a message.  We will attempt to reach you two times.  During this call, we will ask if you have developed any symptoms of COVID 19. If you develop any symptoms (ie: fever, flu-like symptoms, shortness of breath, cough etc.) before then, please call [phone].  If you test positive for Covid 19 in the 2 weeks post procedure, please call and report this information to [place].    If any biopsies were taken you will be contacted by phone or by letter within the next 1-3 weeks.  Please call us at [phone] if you have not heard about the biopsies in 3 weeks.    SIGNATURES/CONFIDENTIALITY: You and/or your care partner have signed paperwork which will be entered into your electronic medical record.  These signatures attest to the fact that that the information above on your After Visit Summary has been reviewed and is understood.  Full responsibility of the confidentiality of this discharge information lies with you and/or your care-partner.  Await pathology  Continue your normal medications   Please read over handout about polyps

## 2019-08-04 NOTE — Progress Notes (Signed)
Pt's states no medical or surgical changes since previsit or office visit. 

## 2019-08-08 ENCOUNTER — Telehealth: Payer: Self-pay

## 2019-08-08 NOTE — Telephone Encounter (Signed)
  Follow up Call-  Call back number 08/04/2019  Post procedure Call Back phone  # [phone]  Permission to leave phone message Yes  Some recent data might be hidden     Left mesage

## 2019-08-08 NOTE — Telephone Encounter (Signed)
Left message on follow up call. 

## 2019-08-09 ENCOUNTER — Encounter: Payer: Self-pay | Admitting: Gastroenterology

## 2019-08-18 NOTE — Progress Notes (Signed)
;   Assessment and Plan: Robert Caldwell was seen today for follow-up.  Diagnoses and all orders for this visit:  Tobacco abuse Smoking cessation-  instruction/counseling given, counseled patient on the dangers of tobacco use, advised patient to stop smoking, and reviewed strategies to maximize success, patient not ready to quit at this time.   Erectile dysfunction, unspecified erectile dysfunction type -     tadalafil (CIALIS) 20 MG tablet; Take 1 tablet (20 mg total) by mouth daily as needed for erectile dysfunction. Advised to quit smoking  Depression, major, recurrent, in partial remission (HCC) -     venlafaxine XR (EFFEXOR XR) 37.5 MG 24 hr capsule; Take 1 capsule (37.5 mg total) by mouth daily. No SI/HI   Future Appointments  Date Time Provider [place]  07/09/2020  9:00 AM Vicie Mutters, PA-C [id number] None    HPI 53 y.o.male presents for medication follow up for depression  He had a colonoscopy with Dr. Loletha Carrow.  He was started on prozac for 3 weeks and states he felt funny and not good. Has decreased energy, low libido. Has had depression for most of his life. Tried wellbutrin in the past with suicidal ideations. Not interested in celexa due to possible inorgasma.    He also had a low B12 suppose to be on it.  Lab Results  Component Value Date   [id number] 07/06/2019    He has never been to counseling.   Past Medical History:  Diagnosis Date  . Anxiety   . Depression      Allergies  Allergen Reactions  . Chantix [Varenicline Tartrate]     Suicidal ideation    Current Outpatient Medications on File Prior to Visit  Medication Sig  . Cyanocobalamin (VITAMIN B-12 PO) Take 5,000 mcg by mouth.  . Multiple Vitamin (MULTI-VITAMIN PO) Take by mouth.  . sildenafil (REVATIO) 20 MG tablet Take 4 tablets by mouth one hour prior to sex as needed. Do not take more than one dose every 24 hours.  Marland Kitchen UNABLE TO FIND CBD oil  . zinc gluconate 50 MG tablet Take 50 mg by  mouth daily.   No current facility-administered medications on file prior to visit.     ROS: all negative except above.   Physical Exam: Filed Weights   08/21/19 1630  Weight: 161 lb 3.2 oz (73.1 kg)   BP 128/90   Pulse 73   Temp 97.7 F (36.5 C)   Wt 161 lb 3.2 oz (73.1 kg)   SpO2 97%   BMI 23.81 kg/m  General Appearance: Well nourished, in no apparent distress. Eyes: PERRLA, EOMs, conjunctiva no swelling or erythema Sinuses: No Frontal/maxillary tenderness ENT/Mouth: Ext aud canals clear, TMs without erythema, bulging. No erythema, swelling, or exudate on post pharynx.  Tonsils not swollen or erythematous. Hearing normal.  Neck: Supple, thyroid normal.  Respiratory: Respiratory effort normal, BS equal bilaterally without rales, rhonchi, wheezing or stridor.  Cardio: RRR with no MRGs. Brisk peripheral pulses without edema.  Abdomen: Soft, + BS.  Non tender, no guarding, rebound, hernias, masses. Lymphatics: Non tender without lymphadenopathy.  Musculoskeletal: Full ROM, 5/5 strength, normal gait.  Skin: Warm, dry without rashes, lesions, ecchymosis.  Neuro: Cranial nerves intact. Normal muscle tone, no cerebellar symptoms. Sensation intact.  Psych: Awake and oriented X 3, normal affect, Insight and Judgment appropriate.     Vicie Mutters, PA-C 4:57 PM [facility]

## 2019-08-21 ENCOUNTER — Encounter: Payer: Self-pay | Admitting: Physician Assistant

## 2019-08-21 ENCOUNTER — Other Ambulatory Visit: Payer: Self-pay

## 2019-08-21 ENCOUNTER — Ambulatory Visit: Payer: 59 | Admitting: Physician Assistant

## 2019-08-21 VITALS — BP 128/90 | HR 73 | Temp 97.7°F | Wt 161.2 lb

## 2019-08-21 DIAGNOSIS — N529 Male erectile dysfunction, unspecified: Secondary | ICD-10-CM | POA: Diagnosis not present

## 2019-08-21 DIAGNOSIS — Z720 Tobacco use: Secondary | ICD-10-CM

## 2019-08-21 DIAGNOSIS — F3341 Major depressive disorder, recurrent, in partial remission: Secondary | ICD-10-CM

## 2019-08-21 MED ORDER — VENLAFAXINE HCL ER 37.5 MG PO CP24: 37.5000 mg | ORAL_CAPSULE | Freq: Every day | ORAL | 2 refills | Status: DC

## 2019-08-21 MED ORDER — TADALAFIL 20 MG PO TABS: 20.0000 mg | ORAL_TABLET | Freq: Every day | ORAL | 2 refills | Status: DC | PRN

## 2019-08-21 NOTE — Patient Instructions (Addendum)
[facility] [email or web address] [email or web address]  Do effexor 37.5 mg a day for 1-2 weeks, and then increase to 2 a day Let me know how you do I can send in a 75mg  a day effexor    Peripheral Vascular Disease Peripheral vascular disease (PVD) is a disease of the blood vessels. A simple term for PVD is poor circulation. In most cases, PVD narrows the blood vessels that carry blood from your heart to the rest of your body. This can result in a decreased supply of blood to your arms, legs, and internal organs, like your stomach or kidneys. However, it most often affects a person's lower legs and feet. There are two types of PVD.  Organic PVD. This is the more common type. It is caused by damage to the structure of blood vessels.  Functional PVD. This is caused by conditions that make blood vessels contract and tighten (spasm). Without treatment, PVD tends to get worse over time. PVD can also lead to acute limb ischemia. This is when an arm or leg suddenly has trouble getting enough blood. This is a medical emergency. What are the causes?  Each type of PVD has many different causes. The most common cause of PVD is buildup of a fatty material (plaque) inside your arteries (atherosclerosis). Small amounts of plaque can break off from the walls of the blood vessels and become lodged in a smaller artery. This blocks blood flow and can cause acute limb ischemia. Other common causes of PVD include:  Blood clots that form inside of blood vessels.  Injuries to blood vessels.  Diseases that cause inflammation of blood vessels or cause blood vessel spasms.  Health behaviors and health history that increase your risk of developing PVD. What increases the risk? You are more likely to develop this condition if:  You have a family history of PVD.  You have certain medical conditions, including: ? High cholesterol. ? Diabetes. ? High blood pressure (hypertension). ? Coronary heart  disease. ? Past problems with blood clots. ? Past injury, such as burns or a broken bone. These may have damaged blood vessels in your limbs. ? Buerger disease. This is caused by inflamed blood vessels in your hands and feet. ? Some forms of arthritis. ? Rare birth defects that affect the arteries in your legs. ? Kidney disease.  You use tobacco or smoke.  You do not get enough exercise.  You are obese.  You are age 33 or older. What are the signs or symptoms? This condition may cause different symptoms. Your symptoms depend on what part of your body is not getting enough blood. Some common signs and symptoms include:  Cramps in your lower legs. This may be a symptom of poor leg circulation (claudication).  Pain and weakness in your legs. This happens while you are physically active but goes away when you rest (intermittent claudication).  Leg pain when at rest.  Leg numbness, tingling, or weakness.  Coldness in a leg or foot, especially when compared with the other leg.  Skin or hair changes. These can include: ? Hair loss. ? Shiny skin. ? Pale or bluish skin. ? Thick toenails.  Inability to get or maintain an erection (erectile dysfunction).  Fatigue. People with PVD are more likely to develop ulcers and sores on their toes, feet, or legs. These may take longer than normal to heal. How is this diagnosed? This condition is diagnosed based on:  Your signs and symptoms.  A physical exam and your medical history.  Other tests to find out what is causing your PVD and to determine its severity. Tests may include: ? Blood pressure recordings from your arms and legs and measurements of the strength of your pulses (pulse volume recordings). ? Imaging studies using sound waves to take pictures of the blood flow through your blood vessels (Doppler ultrasound). ? Injecting a dye into your blood vessels before having imaging studies using:  X-rays (angiogram or  arteriogram).  Computer-generated X-rays (CT angiogram).  A powerful electromagnetic field and a computer (magnetic resonance angiogram or MRA). How is this treated? Treatment for PVD depends on the cause of your condition and how severe your symptoms are. It also depends on your age. Underlying causes need to be treated and controlled. These include long-term (chronic) conditions, such as diabetes, high cholesterol, and high blood pressure. Treatment includes:  Lifestyle changes, such as: ? Quitting smoking. ? Exercising regularly. ? Following a low-fat, low-cholesterol diet.  Taking medicines, such as: ? Blood thinners to prevent blood clots. ? Medicines to improve blood flow. ? Medicines to improve your blood cholesterol levels.  Surgical procedures, such as: ? A procedure that uses an inflated balloon to open a blocked artery and improve blood flow (angioplasty). ? A procedure to put in a wire mesh tube to keep a blocked artery open (stent implant). ? Surgery to reroute blood flow around a blocked artery (peripheral bypass surgery). ? Surgery to remove dead tissue from an infected wound on the affected limb. ? Amputation. This is surgical removal of the affected limb. It may be necessary in cases of acute limb ischemia where there has been no improvement through medical or surgical treatments. Follow these instructions at home: Lifestyle  Do not use any products that contain nicotine or tobacco, such as cigarettes and e-cigarettes. If you need help quitting, ask your health care provider.  Lose weight if you are overweight, and maintain a healthy weight as discussed by your health care provider.  Eat a diet that is low in fat and cholesterol. If you need help, ask your health care provider.  Exercise regularly. Ask your health care provider to suggest some good activities for you. General instructions  Take over-the-counter and prescription medicines only as told by your  health care provider.  Take good care of your feet: ? Wear comfortable shoes that fit well. ? Check your feet often for any cuts or sores.  Keep all follow-up visits as told by your health care provider. This is important. Contact a health care provider if:  You have cramps in your legs while walking.  You have leg pain when you are at rest.  You have coldness in a leg or foot.  Your skin changes.  You have erectile dysfunction.  You have cuts or sores on your feet that are not healing. Get help right away if:  Your arm or leg turns cold, numb, and blue.  Your arms or legs become red, warm, swollen, painful, or numb.  You have chest pain or trouble breathing.  You suddenly have weakness in your face, arm, or leg.  You become very confused or lose the ability to speak.  You suddenly have a very bad headache or lose your vision. Summary  Peripheral vascular disease (PVD) is a disease of the blood vessels.  In most cases, PVD narrows the blood vessels that carry blood from your heart to the rest of your body.  PVD may cause different symptoms. Your symptoms depend on what part of  your body is not getting enough blood.  Treatment for PVD depends on the cause of your condition and how severe your symptoms are. This information is not intended to replace advice given to you by your health care provider. Make sure you discuss any questions you have with your health care provider. Document Released: 01/14/2005 Document Revised: 11/19/2017 Document Reviewed: 01/14/2017 Elsevier Patient Education  2020 [place] With Depression Everyone experiences occasional disappointment, sadness, and loss in their lives. When you are feeling down, blue, or sad for at least 2 weeks in a row, it may mean that you have depression. Depression can affect your thoughts and feelings, relationships, daily activities, and physical health. It is caused by changes in the way your brain  functions. If you receive a diagnosis of depression, your health care provider will tell you which type of depression you have and what treatment options are available to you. If you are living with depression, there are ways to help you recover from it and also ways to prevent it from coming back. How to cope with lifestyle changes Coping with stress     Stress is your body's reaction to life changes and events, both good and bad. Stressful situations may include:  Getting married.  The death of a spouse.  Losing a job.  Retiring.  Having a baby. Stress can last just a few hours or it can be ongoing. Stress can play a major role in depression, so it is important to learn both how to cope with stress and how to think about it differently. Talk with your health care provider or a counselor if you would like to learn more about stress reduction. He or she may suggest some stress reduction techniques, such as:  Music therapy. This can include creating music or listening to music. Choose music that you enjoy and that inspires you.  Mindfulness-based meditation. This kind of meditation can be done while sitting or walking. It involves being aware of your normal breaths, rather than trying to control your breathing.  Centering prayer. This is a kind of meditation that involves focusing on a spiritual word or phrase. Choose a word, phrase, or sacred image that is meaningful to you and that brings you peace.  Deep breathing. To do this, expand your stomach and inhale slowly through your nose. Hold your breath for 3-5 seconds, then exhale slowly, allowing your stomach muscles to relax.  Muscle relaxation. This involves intentionally tensing muscles then relaxing them. Choose a stress reduction technique that fits your lifestyle and personality. Stress reduction techniques take time and practice to develop. Set aside 5-15 minutes a day to do them. Therapists can offer training in these  techniques. The training may be covered by some insurance plans. Other things you can do to manage stress include:  Keeping a stress diary. This can help you learn what triggers your stress and ways to control your response.  Understanding what your limits are and saying no to requests or events that lead to a schedule that is too full.  Thinking about how you respond to certain situations. You may not be able to control everything, but you can control how you react.  Adding humor to your life by watching funny films or TV shows.  Making time for activities that help you relax and not feeling guilty about spending your time this way.  Medicines Your health care provider may suggest certain medicines if he or she feels that they will  help improve your condition. Avoid using alcohol and other substances that may prevent your medicines from working properly (may interact). It is also important to:  Talk with your pharmacist or health care provider about all the medicines that you take, their possible side effects, and what medicines are safe to take together.  Make it your goal to take part in all treatment decisions (shared decision-making). This includes giving input on the side effects of medicines. It is best if shared decision-making with your health care provider is part of your total treatment plan. If your health care provider prescribes a medicine, you may not notice the full benefits of it for 4-8 weeks. Most people who are treated for depression need to be on medicine for at least 6-12 months after they feel better. If you are taking medicines as part of your treatment, do not stop taking medicines without first talking to your health care provider. You may need to have the medicine slowly decreased (tapered) over time to decrease the risk of harmful side effects. Relationships Your health care provider may suggest family therapy along with individual therapy and drug therapy. While there  may not be family problems that are causing you to feel depressed, it is still important to make sure your family learns as much as they can about your mental health. Having your family's support can help make your treatment successful. How to recognize changes in your condition Everyone has a different response to treatment for depression. Recovery from major depression happens when you have not had signs of major depression for two months. This may mean that you will start to:  Have more interest in doing activities.  Feel less hopeless than you did 2 months ago.  Have more energy.  Overeat less often, or have better or improving appetite.  Have better concentration. Your health care provider will work with you to decide the next steps in your recovery. It is also important to recognize when your condition is getting worse. Watch for these signs:  Having fatigue or low energy.  Eating too much or too little.  Sleeping too much or too little.  Feeling restless, agitated, or hopeless.  Having trouble concentrating or making decisions.  Having unexplained physical complaints.  Feeling irritable, angry, or aggressive. Get help as soon as you or your family members notice these symptoms coming back. How to get support and help from others How to talk with friends and family members about your condition  Talking to friends and family members about your condition can provide you with one way to get support and guidance. Reach out to trusted friends or family members, explain your symptoms to them, and let them know that you are working with a health care provider to treat your depression. Financial resources Not all insurance plans cover mental health care, so it is important to check with your insurance carrier. If paying for co-pays or counseling services is a problem, search for a local or county mental health care center. They may be able to offer public mental health care services  at low or no cost when you are not able to see a private health care provider. If you are taking medicine for depression, you may be able to get the generic form, which may be less expensive. Some makers of prescription medicines also offer help to patients who cannot afford the medicines they need. Follow these instructions at home:   Get the right amount and quality of sleep.  Cut  down on using caffeine, tobacco, alcohol, and other potentially harmful substances.  Try to exercise, such as walking or lifting small weights.  Take over-the-counter and prescription medicines only as told by your health care provider.  Eat a healthy diet that includes plenty of vegetables, fruits, whole grains, low-fat dairy products, and lean protein. Do not eat a lot of foods that are high in solid fats, added sugars, or salt.  Keep all follow-up visits as told by your health care provider. This is important. Contact a health care provider if:  You stop taking your antidepressant medicines, and you have any of these symptoms: ? Nausea. ? Headache. ? Feeling lightheaded. ? Chills and body aches. ? Not being able to sleep (insomnia).  You or your friends and family think your depression is getting worse. Get help right away if:  You have thoughts of hurting yourself or others. If you ever feel like you may hurt yourself or others, or have thoughts about taking your own life, get help right away. You can go to your nearest emergency department or call:  Your local emergency services (911 in the [place]  A suicide crisis helpline, such as the [facility] at 613-609-8574. This is open 24-hours a day. Summary  If you are living with depression, there are ways to help you recover from it and also ways to prevent it from coming back.  Work with your health care team to create a management plan that includes counseling, stress management techniques, and healthy lifestyle  habits. This information is not intended to replace advice given to you by your health care provider. Make sure you discuss any questions you have with your health care provider. Document Released: 11/09/2016 Document Revised: 03/31/2019 Document Reviewed: 11/09/2016 Elsevier Patient Education  2020 [employer].

## 2020-01-18 ENCOUNTER — Encounter: Payer: Self-pay | Admitting: Physician Assistant

## 2020-01-18 DIAGNOSIS — F3341 Major depressive disorder, recurrent, in partial remission: Secondary | ICD-10-CM | POA: Insufficient documentation

## 2020-01-18 DIAGNOSIS — Z79899 Other long term (current) drug therapy: Secondary | ICD-10-CM | POA: Insufficient documentation

## 2020-01-18 DIAGNOSIS — Z720 Tobacco use: Secondary | ICD-10-CM | POA: Insufficient documentation

## 2020-01-18 DIAGNOSIS — E559 Vitamin D deficiency, unspecified: Secondary | ICD-10-CM | POA: Insufficient documentation

## 2020-01-18 DIAGNOSIS — N529 Male erectile dysfunction, unspecified: Secondary | ICD-10-CM | POA: Insufficient documentation

## 2020-01-18 DIAGNOSIS — E782 Mixed hyperlipidemia: Secondary | ICD-10-CM | POA: Insufficient documentation

## 2020-01-18 HISTORY — DX: Mixed hyperlipidemia: E78.2
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
## 2020-01-19 ENCOUNTER — Ambulatory Visit: Payer: 59 | Admitting: Physician Assistant

## 2020-02-14 ENCOUNTER — Other Ambulatory Visit: Payer: Self-pay | Admitting: Physician Assistant

## 2020-02-14 DIAGNOSIS — N529 Male erectile dysfunction, unspecified: Secondary | ICD-10-CM

## 2020-02-14 MED ORDER — TADALAFIL 20 MG PO TABS: ORAL_TABLET | ORAL | 2 refills | Status: DC

## 2020-03-03 ENCOUNTER — Other Ambulatory Visit: Payer: Self-pay | Admitting: Physician Assistant

## 2020-03-03 DIAGNOSIS — N529 Male erectile dysfunction, unspecified: Secondary | ICD-10-CM

## 2020-03-11 ENCOUNTER — Encounter: Payer: 59 | Admitting: Physician Assistant

## 2020-04-11 ENCOUNTER — Other Ambulatory Visit: Payer: Self-pay

## 2020-04-11 DIAGNOSIS — F3341 Major depressive disorder, recurrent, in partial remission: Secondary | ICD-10-CM

## 2020-04-11 MED ORDER — VENLAFAXINE HCL ER 37.5 MG PO CP24: 37.5000 mg | ORAL_CAPSULE | Freq: Every day | ORAL | 2 refills | Status: DC

## 2020-04-11 NOTE — Telephone Encounter (Signed)
Pt states that when he takes the TADALAFIL every 3 days it gives him Heartburn so he is requesting to go to taking it everyday. Will need Rx to states this if you ok it.   Please & thank you
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
## 2020-05-10 ENCOUNTER — Ambulatory Visit: Payer: 59 | Admitting: Physician Assistant

## 2020-05-10 ENCOUNTER — Encounter: Payer: Self-pay | Admitting: Physician Assistant

## 2020-05-10 ENCOUNTER — Other Ambulatory Visit: Payer: Self-pay

## 2020-05-10 VITALS — BP 136/80 | HR 79 | Temp 97.7°F | Ht 69.0 in | Wt 166.0 lb

## 2020-05-10 DIAGNOSIS — F10280 Alcohol dependence with alcohol-induced anxiety disorder: Secondary | ICD-10-CM

## 2020-05-10 DIAGNOSIS — F3341 Major depressive disorder, recurrent, in partial remission: Secondary | ICD-10-CM

## 2020-05-10 DIAGNOSIS — F1020 Alcohol dependence, uncomplicated: Secondary | ICD-10-CM | POA: Insufficient documentation

## 2020-05-10 DIAGNOSIS — E782 Mixed hyperlipidemia: Secondary | ICD-10-CM | POA: Diagnosis not present

## 2020-05-10 DIAGNOSIS — N529 Male erectile dysfunction, unspecified: Secondary | ICD-10-CM | POA: Diagnosis not present

## 2020-05-10 DIAGNOSIS — Z720 Tobacco use: Secondary | ICD-10-CM | POA: Diagnosis not present

## 2020-05-10 MED ORDER — TADALAFIL 20 MG PO TABS: ORAL_TABLET | ORAL | 0 refills | Status: DC

## 2020-05-10 NOTE — Patient Instructions (Signed)
Please contact the [place] [facility].   Alcohol Use Disorder Alcohol use disorder is when your drinking disrupts your daily life. When you have this condition, you drink too much alcohol and you cannot control your drinking. Alcohol use disorder can cause serious problems with your physical health. It can affect your brain, heart, liver, pancreas, immune system, stomach, and intestines. Alcohol use disorder can increase your risk for certain cancers and cause problems with your mental health, such as depression, anxiety, psychosis, delirium, and dementia. People with this disorder risk hurting themselves and others. What are the causes? This condition is caused by drinking too much alcohol over time. It is not caused by drinking too much alcohol only one or two times. Some people with this condition drink alcohol to cope with or escape from negative life events. Others drink to relieve pain or symptoms of mental illness. What increases the risk? You are more likely to develop this condition if:  You have a family history of alcohol use disorder.  Your culture encourages drinking to the point of intoxication, or makes alcohol easy to get.  You had a mood or conduct disorder in childhood.  You have been a victim of abuse.  You are an adolescent and: ? You have poor grades or difficulties in school. ? Your caregivers do not talk to you about saying no to alcohol, or supervise your activities. ? You are impulsive or you have trouble with self-control. What are the signs or symptoms? Symptoms of this condition include:  Drinkingmore than you want to.  Drinking for longer than you want to.  Trying several times to drink less or to control your drinking.  Spending a lot of time getting alcohol, drinking, or recovering from drinking.  Craving alcohol.  Having problems at work, at school, or at home due to drinking.  Having problems in relationships due to drinking.  Drinking  when it is dangerous to drink, such as before driving a car.  Continuing to drink even though you know you might have a physical or mental problem related to drinking.  Needing more and more alcohol to get the same effect you want from the alcohol (building up tolerance).  Having symptoms of withdrawal when you stop drinking. Symptoms of withdrawal include: ? Fatigue. ? Nightmares. ? Trouble sleeping. ? Depression. ? Anxiety. ? Fever. ? Seizures. ? Severe confusion. ? Feeling or seeing things that are not there (hallucinations). ? Tremors. ? Rapid heart rate. ? Rapid breathing. ? High blood pressure.  Drinking to avoid symptoms of withdrawal. How is this diagnosed? This condition is diagnosed with an assessment. Your health care provider may start the assessment by asking three or four questions about your drinking. Your health care provider may perform a physical exam or do lab tests to see if you have physical problems resulting from alcohol use. She or he may refer you to a mental health professional for evaluation. How is this treated? Some people with alcohol use disorder are able to reduce their alcohol use to low-risk levels. Others need to completely quit drinking alcohol. When necessary, mental health professionals with specialized training in substance use treatment can help. Your health care provider can help you decide how severe your alcohol use disorder is and what type of treatment you need. The following forms of treatment are available:  Detoxification. Detoxification involves quitting drinking and using prescription medicines within the first week to help lessen withdrawal symptoms. This treatment is important for people who  have had withdrawal symptoms before and for heavy drinkers who are likely to have withdrawal symptoms. Alcohol withdrawal can be dangerous, and in severe cases, it can cause death. Detoxification may be provided in a home, community, or primary care  setting, or in a hospital or substance use treatment facility.  Counseling. This treatment is also called talk therapy. It is provided by substance use treatment counselors. A counselor can address the reasons you use alcohol and suggest ways to keep you from drinking again or to prevent problem drinking. The goals of talk therapy are to: ? Find healthy activities and ways for you to cope with stress. ? Identify and avoid the things that trigger your alcohol use. ? Help you learn how to handle cravings.  Medicines.Medicines can help treat alcohol use disorder by: ? Decreasing alcohol cravings. ? Decreasing the positive feeling you have when you drink alcohol. ? Causing an uncomfortable physical reaction when you drink alcohol (aversion therapy).  Support groups. Support groups are led by people who have quit drinking. They provide emotional support, advice, and guidance. These forms of treatment are often combined. Some people with this condition benefit from a combination of treatments provided by specialized substance use treatment centers. Follow these instructions at home:  Take over-the-counter and prescription medicines only as told by your health care provider.  Check with your health care provider before starting any new medicines.  Ask friends and family members not to offer you alcohol.  Avoid situations where alcohol is served, including gatherings where others are drinking alcohol.  Create a plan for what to do when you are tempted to use alcohol.  Find hobbies or activities that you enjoy that do not include alcohol.  Keep all follow-up visits as told by your health care provider. This is important. How is this prevented?  If you drink, limit alcohol intake to no more than 1 drink a day for nonpregnant women and 2 drinks a day for men. One drink equals 12 oz of beer, 5 oz of wine, or 1 oz of hard liquor.  If you have a mental health condition, get treatment and  support.  Do not give alcohol to adolescents.  If you are an adolescent: ? Do not drink alcohol. ? Do not be afraid to say no if someone offers you alcohol. Speak up about why you do not want to drink. You can be a positive role model for your friends and set a good example for those around you by not drinking alcohol. ? If your friends drink, spend time with others who do not drink alcohol. Make new friends who do not use alcohol. ? Find healthy ways to manage stress and emotions, such as meditation or deep breathing, exercise, spending time in nature, listening to music, or talking with a trusted friend or family member. Contact a health care provider if:  You are not able to take your medicines as told.  Your symptoms get worse.  You return to drinking alcohol (relapse) and your symptoms get worse. Get help right away if:  You have thoughts about hurting yourself or others. If you ever feel like you may hurt yourself or others, or have thoughts about taking your own life, get help right away. You can go to your nearest emergency department or call:  Your local emergency services (911 in the [place]  A suicide crisis helpline, such as the [facility] at 228-397-9295. This is open 24 hours a day. Summary  Alcohol  use disorder is when your drinking disrupts your daily life. When you have this condition, you drink too much alcohol and you cannot control your drinking.  Treatment may include detoxification, counseling, medicine, and support groups.  Ask friends and family members not to offer you alcohol. Avoid situations where alcohol is served.  Get help right away if you have thoughts about hurting yourself or others. This information is not intended to replace advice given to you by your health care provider. Make sure you discuss any questions you have with your health care provider. Document Revised: 11/19/2017 Document Reviewed: 09/03/2016 Elsevier  Patient Education  [place].

## 2020-05-14 ENCOUNTER — Telehealth: Payer: Self-pay | Admitting: Physician Assistant

## 2020-05-14 NOTE — Telephone Encounter (Signed)
-----   Message from Elenor Quinones, [place] sent at 05/13/2020  2:36 PM EDT ----- Regarding: PHONE/OFFICE NOTE Contact: [phone] PER PHONE/FRONT OFFICE   PATIENT states you told him to call you after he "LOOKED" into something?   Please & thank you

## 2020-05-20 ENCOUNTER — Other Ambulatory Visit: Payer: Self-pay | Admitting: Internal Medicine

## 2020-05-20 DIAGNOSIS — F10931 Alcohol use, unspecified with withdrawal delirium: Secondary | ICD-10-CM

## 2020-05-20 MED ORDER — CHLORDIAZEPOXIDE HCL 25 MG PO CAPS: ORAL_CAPSULE | ORAL | 0 refills | Status: DC

## 2020-05-20 MED ORDER — TRAZODONE HCL 150 MG PO TABS: ORAL_TABLET | ORAL | 0 refills | Status: DC

## 2020-05-20 MED ORDER — NALTREXONE HCL 50 MG PO TABS: ORAL_TABLET | ORAL | 0 refills | Status: DC

## 2020-05-20 NOTE — Progress Notes (Signed)
   Patient has long hx/o Alcohol Abuse and self admitted 2 &1/2 days ago on 5/28 to [facility] and was started on treatment with Librium, Trazodone & Hydroxyzine and today signed out AMA without any Rx's dispensed. He requests interim  meds until he can schedule f/u OV with [facility] in 5 day Rx for Librium 25 mg tid, Depade 50 mg qd and Desyrel 150 mg qhs.

## 2020-05-21 ENCOUNTER — Telehealth: Payer: Self-pay | Admitting: Physician Assistant

## 2020-05-21 NOTE — Telephone Encounter (Signed)
-----   Message from Elenor Quinones, [place] sent at 05/21/2020 12:20 PM EDT ----- Regarding: OFFICE NOTE Contact: [phone] PATIENT has requested a RETURN call from you when you get a chance to return call.  Thank you!

## 2020-05-21 NOTE — Telephone Encounter (Signed)
Patient was at in patient treatment at [facility] for alcohol detox but he left AMA due to responsibilities to his GF and her 3 kids. He was given medications per Dr. Melford Aase to last until Friday and then he will run out.  He has started going to [place] and he has called the [facility] to try to do outpatient alcohol treatment.   We will keep him out of work until next Wednesday. He states he is very anxious and dealing with withdrawals. No seizures, no hallucinations. Not sleeping well, he is taking the trazadone. We will try to get him an appointment Friday and refill the medications for at least a week while he secures out patient treatment. He understands the risk of DT's doing out patient treatment.

## 2020-05-24 ENCOUNTER — Encounter: Payer: Self-pay | Admitting: Internal Medicine

## 2020-05-24 ENCOUNTER — Ambulatory Visit: Payer: 59 | Admitting: Internal Medicine

## 2020-05-24 ENCOUNTER — Other Ambulatory Visit: Payer: Self-pay

## 2020-05-24 VITALS — BP 128/84 | HR 80 | Temp 97.4°F | Resp 16 | Ht 69.0 in | Wt 161.2 lb

## 2020-05-24 DIAGNOSIS — F10931 Alcohol use, unspecified with withdrawal delirium: Secondary | ICD-10-CM

## 2020-05-24 DIAGNOSIS — F3341 Major depressive disorder, recurrent, in partial remission: Secondary | ICD-10-CM

## 2020-05-24 DIAGNOSIS — F10280 Alcohol dependence with alcohol-induced anxiety disorder: Secondary | ICD-10-CM

## 2020-05-24 DIAGNOSIS — F419 Anxiety disorder, unspecified: Secondary | ICD-10-CM | POA: Diagnosis not present

## 2020-05-24 DIAGNOSIS — F10231 Alcohol dependence with withdrawal delirium: Secondary | ICD-10-CM

## 2020-05-24 MED ORDER — NALTREXONE HCL 50 MG PO TABS: ORAL_TABLET | ORAL | 0 refills | Status: DC

## 2020-05-24 MED ORDER — CHLORDIAZEPOXIDE HCL 10 MG PO CAPS: ORAL_CAPSULE | ORAL | 0 refills | Status: DC

## 2020-05-24 NOTE — Progress Notes (Signed)
     History of Present Illness:    Patient is a 54 yo WM with hx/o Alcoholism and was recently hospitalized 2-3 days for detox and signed himself out AMA. Then he contacted this provider and was prescribed a 5 day supply of Librium 25 mg tid (#15) and Depade 50 mg qd (#5) to allow him to establish with the [facility] for out-patient detox and rehab. He presents today feeling well w/o sx's of tremor or agitation. He recently was given FMLA to be out of work 5/25 - 31. And has been out of work this week since June 1st thru today. He request time off thru 6/11 to allow him to establish with the [facility]. (Given an OOW excuse for 6/1-11).   Medications  .  Cyanocobalamin (VITAMIN B-12 PO), Take 5,000 mcg by mouth. .  naltrexone (DEPADE) 50 MG tablet, Take 1 tablet Daily to prevent Alcohol Cravings .  chlordiazePOXIDE (LIBRIUM) 10 MG capsule, Take 1 capsule 3 x /day for anxiety .  Multiple Vitamin (MULTI-VITAMIN PO), Take by mouth. .  zinc gluconate 50 MG tablet, Take 50 mg by mouth daily.  Problem list He has Tobacco abuse; Erectile dysfunction; Depression, major, recurrent, in partial remission ([place]); Medication management; Vitamin D deficiency; Mixed hyperlipidemia; and Alcoholism with alcohol dependence ([place]) on their problem list.   Observations/Objective:  BP 128/84   Pulse 80   Temp (!) 97.4 F (36.3 C)   Resp 16   Ht 5\' 9"  (1.753 m)   Wt 161 lb 3.2 oz (73.1 kg)   BMI 23.81 kg/m   HEENT - WNL. Neck - supple.  Chest - Clear equal BS. Cor - Nl HS. RRR w/o sig MGR. PP 1(+). No edema. MS- FROM w/o deformities.  Gait Nl. Neuro -  Nl w/o focal abnormalities.  Assessment and Plan:  1. Chronic anxiety  2. Alcohol dependence with alcohol-induced anxiety disorder ([place])  3. DTs (delirium tremens) ([facility])  - naltrexone (DEPADE) 50 MG tablet; Take 1 tablet Daily to prevent Alcohol Cravings  Dispense: 30 tablet; Refill: 0 - chlordiazePOXIDE (LIBRIUM) 10 MG capsule; Take 1  capsule 3 x /day for anxiety  Dispense: 90 capsule; Refill: 0  - F/u at [facility]       I discussed the assessment and treatment plan with the patient. The patient was provided an opportunity to ask questions and all were answered. The patient agreed with the plan and demonstrated an understanding of the instructions. Between 15-20 minutes of counseling, chart review, and critical decision making was performed        The patient was advised to call back or seek an in-person evaluation if the syptoms worsen or if the condition fails to improve as anticipated.    Kirtland Bouchard, MD

## 2020-05-24 NOTE — Patient Instructions (Signed)
Delirium Tremens Delirium tremens, also known as DTs, is the most severe form of alcohol withdrawal. Alcohol withdrawal is a group of symptoms that can develop when a person who drinks heavily and regularly stops drinking or drinks less than usual. This condition causes a change in consciousness or awareness (delirium), feeling or seeing things that other people do not feel or see (hallucinations), and changes in heart rate and rhythm, body temperature, oxygen levels, and blood pressure (vital signs). DTs is an emergency and can be life-threatening. It must be treated in a hospital or a treatment center. What are the causes? This condition is caused when you drink heavily and regularly and then stop drinking or reduce the amount you drink. Heavy and regular drinking can cause chemicals that send signals from the brain to the body (neurotransmitters) to deactivate. Alcohol withdrawal, including DTs, develops when these deactivated neurotransmitters reactivate because you stop drinking or drink less than usual. This causes the brain to become overactive. What increases the risk? The more a person drinks and the longer he or she drinks, the greater the risk of DTs. This condition is more likely to develop in people who have a history of drinking heavily and:  Have had severe alcohol withdrawal or DTs in the past.  Have had a seizure during a previous episode of alcohol withdrawal.  Are elderly.  Are pregnant.  Use illegal drugs.  Take medicines to help them relax (sedatives).  Have certain medical problems, including: ? Infections, such as pneumonia. ? Heart, lung, or liver disease. ? A history of seizures. ? Behavioral health conditions. What are the signs or symptoms? Early symptoms of alcohol withdrawal happen before symptoms of DTs. These symptoms may start within 6 hours after the most recent drink, and they may last for 5-7 days. Early symptoms of alcohol withdrawal may  include:  Problems sleeping (insomnia).  Anxiety.  Problems thinking clearly.  Irritability.  Fast breathing.  Headache.  Nausea and vomiting.  Fever.  Excessive sweating.  A heartbeat that feels irregular or faster than normal.  Involuntary shaking or trembling of a body part (tremors). Symptoms of DTs may begin after early withdrawal symptoms. These symptoms may start to develop 3 days after the most recent drink. They may last for up to 8 days. Symptoms of DTs may include:  Changes in attention and awareness. These changes may be quick and can vary.  Sudden changes in a person's vital signs. Most often, the heart rate and body temperature will rise.  Hallucinations.  Extreme sleepiness and difficulty being awakened.  Extreme confusion.  Seizures. How is this diagnosed? This condition may be diagnosed based on:  Your medical history.  Having a history of using alcohol and then stopping using it.  Your symptoms.  Blood tests.  Urine tests.  A physical exam. Your health care provider may check for: ? Fever. ? High blood pressure. ? Rapid or irregular heart rate. ? Overactive reflexes. ? Tremors. ? Problems thinking clearly. ? Hallucinations. How is this treated? Treatment for this condition is done in a hospital or treatment center. It may include:  Monitoring of your vital signs.  Fluids and minerals (electrolytes) given through an IV.  Sedatives.  Medicines that reduce anxiety.  Medicines to control your blood pressure.  Medicines to prevent or control seizures.  Multivitamins and B vitamins.  Nutrition by mouth or, if needed, through an IV.  Anti-nausea medicines. Ongoing counseling and behavioral health support are an important part of treatment if  you have a history of alcohol abuse or if you use illegal drugs. Follow these instructions at home: Medicines  Take over-the-counter and prescription medicines only as told by your health  care provider.  If you were prescribed sedatives, take them only as told by your health care provider. Eating and drinking   Do not drink alcohol.  Drink enough fluid to keep your urine pale yellow. General instructions  Do not drive or operate heavy machinery until your health care provider approves.  Do not try to withdraw from heavy, prolonged use of alcohol without help from a health care provider.  Have someone stay with you in case you need help.  Consider joining a 12-step program or another alcohol support group.  Keep all follow-up visits as told by your health care provider. This is important. Contact a health care provider if you:  Have symptoms of alcohol withdrawal that get worse or do not go away.  Feel depressed.  Start drinking alcohol again (relapse).  Cannot eat or drink without vomiting.  Are not able to stop drinking alcohol. Get help right away if you:  Feel you may hurt yourself or others.  Have a seizure.  Have a fever along with other symptoms of alcohol withdrawal. These symptoms may include shakiness, irritability, insomnia, nausea, and vomiting.  Become extremely confused.  Have hallucinations.  Become extremely sleepy and have difficulty staying awake.  Vomit blood.  Have extreme tremors or sweating.  Have a heartbeat that feels irregular or faster than normal.  Have chest pain.  Have difficulty breathing. If you ever feel like you may hurt yourself or others, or have thoughts about taking your own life, get help right away. You can go to your nearest emergency department or call:  Your local emergency services (911 in the [place]  A suicide crisis helpline, such as the [place] at 703-678-4930. This is open 24 hours a day. These symptoms may represent a serious problem that is an emergency. Do not wait to see if the symptoms will go away. Get medical help right away. Call your local emergency services  (911 in the [place] Do not drive yourself to the hospital. Summary  Delirium tremens, also known as DTs, is the most severe form of alcohol withdrawal.  This condition causes a change in consciousness or awareness (delirium), feeling or seeing things that other people do not feel or see (hallucinations), and changes in heart rate and rhythm, body temperature, oxygen levels, and blood pressure (vital signs).  DTs is an emergency and can be life-threatening. It must be treated in a hospital or a treatment center.  The more a person drinks and the longer he or she drinks, the greater the risk of DTs.  Contact a health care provider if you start drinking alcohol again (relapse). This information is not intended to replace advice given to you by your health care provider. Make sure you discuss any questions you have with your health care provider. Document Revised: 12/26/2018 Document Reviewed: 12/26/2018 Elsevier Patient Education  [employer].

## 2020-07-09 ENCOUNTER — Encounter: Payer: 59 | Admitting: Physician Assistant
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
## 2020-07-17 ENCOUNTER — Ambulatory Visit: Payer: 59 | Admitting: Physician Assistant

## 2020-07-17 ENCOUNTER — Encounter: Payer: Self-pay | Admitting: Physician Assistant

## 2020-07-17 ENCOUNTER — Other Ambulatory Visit: Payer: Self-pay

## 2020-07-17 VITALS — BP 124/70 | HR 79 | Temp 97.9°F | Ht 69.5 in | Wt 163.0 lb

## 2020-07-17 DIAGNOSIS — Z136 Encounter for screening for cardiovascular disorders: Secondary | ICD-10-CM | POA: Diagnosis not present

## 2020-07-17 DIAGNOSIS — E559 Vitamin D deficiency, unspecified: Secondary | ICD-10-CM

## 2020-07-17 DIAGNOSIS — Z130 Encounter for screening for diseases of the blood and blood-forming organs and certain disorders involving the immune mechanism: Secondary | ICD-10-CM

## 2020-07-17 DIAGNOSIS — F3341 Major depressive disorder, recurrent, in partial remission: Secondary | ICD-10-CM

## 2020-07-17 DIAGNOSIS — Z79899 Other long term (current) drug therapy: Secondary | ICD-10-CM

## 2020-07-17 DIAGNOSIS — I10 Essential (primary) hypertension: Secondary | ICD-10-CM | POA: Diagnosis not present

## 2020-07-17 DIAGNOSIS — Z0001 Encounter for general adult medical examination with abnormal findings: Secondary | ICD-10-CM

## 2020-07-17 DIAGNOSIS — F10280 Alcohol dependence with alcohol-induced anxiety disorder: Secondary | ICD-10-CM

## 2020-07-17 DIAGNOSIS — Z0000 Encounter for general adult medical examination without abnormal findings: Secondary | ICD-10-CM

## 2020-07-17 DIAGNOSIS — Z1329 Encounter for screening for other suspected endocrine disorder: Secondary | ICD-10-CM

## 2020-07-17 DIAGNOSIS — E782 Mixed hyperlipidemia: Secondary | ICD-10-CM

## 2020-07-17 DIAGNOSIS — N529 Male erectile dysfunction, unspecified: Secondary | ICD-10-CM

## 2020-07-17 DIAGNOSIS — Z1389 Encounter for screening for other disorder: Secondary | ICD-10-CM

## 2020-07-17 DIAGNOSIS — Z720 Tobacco use: Secondary | ICD-10-CM

## 2020-07-17 DIAGNOSIS — Z125 Encounter for screening for malignant neoplasm of prostate: Secondary | ICD-10-CM

## 2020-07-17 DIAGNOSIS — Z114 Encounter for screening for human immunodeficiency virus [HIV]: Secondary | ICD-10-CM

## 2020-07-17 NOTE — Patient Instructions (Addendum)
INFORMATION ABOUT YOUR XRAY [facility] Can walk into [address] for an Insurance account manager. They will have the order and take you back. You do not any paper work, I should get the result back today or tomorrow. This order is good for a year.  Can call [phone] to schedule an appointment if you wish.   For your right nipple- use heating pad- if you start to have pus call and I will send in an antibiotic if you get nipple changes, night sweats, lumps/bumps on pec/under arm we will order a diagnostic mammogram.    Fatigue If you have fatigue, you feel tired all the time and have a lack of energy or a lack of motivation. Fatigue may make it difficult to start or complete tasks because of exhaustion. In general, occasional or mild fatigue is often a normal response to activity or life. However, long-lasting (chronic) or extreme fatigue may be a symptom of a medical condition. Follow these instructions at home: General instructions  Watch your fatigue for any changes.  Go to bed and get up at the same time every day.  Avoid fatigue by pacing yourself during the day and getting enough sleep at night.  Maintain a healthy weight. Medicines  Take over-the-counter and prescription medicines only as told by your health care provider.  Take a multivitamin, if told by your health care provider.  Do not use herbal or dietary supplements unless they are approved by your health care provider. Activity   Exercise regularly, as told by your health care provider.  Use or practice techniques to help you relax, such as yoga, tai chi, meditation, or massage therapy. Eating and drinking   Avoid heavy meals in the evening.  Eat a well-balanced diet, which includes lean proteins, whole grains, plenty of fruits and vegetables, and low-fat dairy products.  Avoid consuming too much caffeine.  Avoid the use of alcohol.  Drink enough fluid to keep your urine pale yellow. Lifestyle  Change  situations that cause you stress. Try to keep your work and personal schedule in balance.  Do not use any products that contain nicotine or tobacco, such as cigarettes and e-cigarettes. If you need help quitting, ask your health care provider.  Do not use drugs. Contact a health care provider if:  Your fatigue does not get better.  You have a fever.  You suddenly lose or gain weight.  You have headaches.  You have trouble falling asleep or sleeping through the night.  You feel angry, guilty, anxious, or sad.  You are unable to have a bowel movement (constipation).  Your skin is dry.  You have swelling in your legs or another part of your body. Get help right away if:  You feel confused.  Your vision is blurry.  You feel faint or you pass out.  You have a severe headache.  You have severe pain in your abdomen, your back, or the area between your waist and hips (pelvis).  You have chest pain, shortness of breath, or an irregular or fast heartbeat.  You are unable to urinate, or you urinate less than normal.  You have abnormal bleeding, such as bleeding from the rectum, vagina, nose, lungs, or nipples.  You vomit blood.  You have thoughts about hurting yourself or others. If you ever feel like you may hurt yourself or others, or have thoughts about taking your own life, get help right away. You can go to your nearest emergency department or call:  Your local emergency services (911 in the [place]  A suicide crisis helpline, such as the [place] at 516-519-7503. This is open 24 hours a day. Summary  If you have fatigue, you feel tired all the time and have a lack of energy or a lack of motivation.  Fatigue may make it difficult to start or complete tasks because of exhaustion.  Long-lasting (chronic) or extreme fatigue may be a symptom of a medical condition.  Exercise regularly, as told by your health care provider.  Change  situations that cause you stress. Try to keep your work and personal schedule in balance. This information is not intended to replace advice given to you by your health care provider. Make sure you discuss any questions you have with your health care provider. Document Revised: 06/28/2019 Document Reviewed: 09/01/2017 Elsevier Patient Education  [employer].    Here is some information about the vaccines. The data is very good and this information hopefully answers a lot of your questions and give you a confidence boost.   The Pfizer and Moderna vaccines are messenger RNA vaccines. That technology is not new, it has been studied for 20 years at least, used for cancer and MS treatment. They had started using the vaccine for MERS AND SARS (both a different coronavirus) 10 years ago but never finished so we had a good backbone for this vaccine.   There were no short cuts with the techniques for these clinical  trials, just lots of willing participants quickly and lots of up front money helped speed up the process.   The mRNA is very fragile which is why it needs to be kept so cold and thawed a certain way, think of it as a message in a glass bottle. NO PART of the virus is in this vaccine, it is a clip of the genetic sequence. This mRNA is injected in your arm, connects with a ribosome, delivers the message and the degrades.  That is part of the cause of the sore arm, the mRNA never leaves your arm. It degrades there. The mRNA does not go into our nucleotide where our DNA is, and we would need a DNA reverse transcriptase to take RNA to DNA, we do not have this, it can not change our DNA. The ribosome that got the message creates a protein and that protein circulates in our body and we have an immune reaction to that creating antibodies. Any time our immune system is triggered, inflammation is triggered too so you can have a temp, muscle aches, etc. Normal reaction.  I have seen so many patients  that just had mild COVID in the office weeks later still have issues. We are still learning about post COVID syndrome, the CDC should be coming out for guidelines for practioners soon. There is too much unknown about COVID. We have been using vaccines for over 100 years or more, i Personnel officer. Ask your parents or any older friends about polio and that vaccine, that was a disease shutting down schools,had kids in iron lung, devastating young kids and families. People lined up for that vaccine and technology has only improved.   Please get the vaccine. If you have any further questions please make an appointment in the office to discuss further.  Estill Bamberg

## 2020-07-22 LAB — VITAMIN D 25 HYDROXY (VIT D DEFICIENCY, FRACTURES): Vit D, 25-Hydroxy: 36 ng/mL (ref 30–100)

## 2020-07-22 LAB — COMPLETE METABOLIC PANEL WITH GFR
AG Ratio: 1.6 (calc) (ref 1.0–2.5)
ALT: 15 U/L (ref 9–46)
AST: 15 U/L (ref 10–35)
Albumin: 4.1 g/dL (ref 3.6–5.1)
Alkaline phosphatase (APISO): 75 U/L (ref 35–144)
BUN: 9 mg/dL (ref 7–25)
CO2: 26 mmol/L (ref 20–32)
Calcium: 8.9 mg/dL (ref 8.6–10.3)
Chloride: 105 mmol/L (ref 98–110)
Creat: 0.78 mg/dL (ref 0.70–1.33)
GFR, Est African American: 121 mL/min/{1.73_m2} (ref >=60)
GFR, Est Non African American: 105 mL/min/{1.73_m2} (ref >=60)
Globulin: 2.5 g/dL (calc) (ref 1.9–3.7)
Glucose, Bld: 102 mg/dL — ABNORMAL HIGH (ref 65–99)
Potassium: 4.2 mmol/L (ref 3.5–5.3)
Sodium: 138 mmol/L (ref 135–146)
Total Bilirubin: 0.4 mg/dL (ref 0.2–1.2)
Total Protein: 6.6 g/dL (ref 6.1–8.1)

## 2020-07-22 LAB — URINALYSIS, ROUTINE W REFLEX MICROSCOPIC
Bilirubin Urine: NEGATIVE
Glucose, UA: NEGATIVE
Hgb urine dipstick: NEGATIVE
Ketones, ur: NEGATIVE
Leukocytes,Ua: NEGATIVE
Nitrite: NEGATIVE
Protein, ur: NEGATIVE
Specific Gravity, Urine: 1.005 (ref 1.001–1.03)
pH: 6.5 (ref 5.0–8.0)

## 2020-07-22 LAB — CBC WITH DIFFERENTIAL/PLATELET
Absolute Monocytes: 490 cells/uL (ref 200–950)
Basophils Absolute: 41 cells/uL (ref 0–200)
Basophils Relative: 0.7 %
Eosinophils Absolute: 83 cells/uL (ref 15–500)
Eosinophils Relative: 1.4 %
HCT: 43.4 % (ref 38.5–50.0)
Hemoglobin: 15.2 g/dL (ref 13.2–17.1)
Lymphs Abs: 2059 cells/uL (ref 850–3900)
MCH: 31.9 pg (ref 27.0–33.0)
MCHC: 35 g/dL (ref 32.0–36.0)
MCV: 91 fL (ref 80.0–100.0)
MPV: 11.2 fL (ref 7.5–12.5)
Monocytes Relative: 8.3 %
Neutro Abs: 3227 cells/uL (ref 1500–7800)
Neutrophils Relative %: 54.7 %
Platelets: 163 10*3/uL (ref 140–400)
RBC: 4.77 10*6/uL (ref 4.20–5.80)
RDW: 12.6 % (ref 11.0–15.0)
Total Lymphocyte: 34.9 %
WBC: 5.9 10*3/uL (ref 3.8–10.8)

## 2020-07-22 LAB — PSA: PSA: 0.9 ng/mL (ref <=4.0)

## 2020-07-22 LAB — LIPID PANEL
Cholesterol: 197 mg/dL (ref <200)
HDL: 36 mg/dL — ABNORMAL LOW (ref >=40)
LDL Cholesterol (Calc): 127 mg/dL (calc) — ABNORMAL HIGH
Non-HDL Cholesterol (Calc): 161 mg/dL (calc) — ABNORMAL HIGH (ref <130)
Total CHOL/HDL Ratio: 5.5 (calc) — ABNORMAL HIGH (ref <5.0)
Triglycerides: 196 mg/dL — ABNORMAL HIGH (ref <150)

## 2020-07-22 LAB — MICROALBUMIN / CREATININE URINE RATIO
Creatinine, Urine: 30 mg/dL (ref 20–320)
Microalb, Ur: <0.2 mg/dL

## 2020-07-22 LAB — VITAMIN B1, WHOLE BLOOD: Vitamin B1 (Thiamine), Blood: 128 nmol/L (ref 78–185)

## 2020-07-22 LAB — TSH: TSH: 1.36 mIU/L (ref 0.40–4.50)

## 2020-07-22 LAB — VITAMIN B12: Vitamin B-12: 503 pg/mL (ref 200–1100)

## 2020-07-22 LAB — IRON, TOTAL/TOTAL IRON BINDING CAP: Iron: 67 ug/dL (ref 50–180)

## 2020-07-22 LAB — MAGNESIUM: Magnesium: 2.1 mg/dL (ref 1.5–2.5)

## 2020-07-22 LAB — TESTOSTERONE: Testosterone: 287 ng/dL (ref 250–827)

## 2020-10-01 ENCOUNTER — Telehealth: Payer: Self-pay

## 2020-10-01 NOTE — Telephone Encounter (Signed)
Patient states that his testosterone was low and wants to discuss possible testosterone treatment. Since he usually sees Estill Bamberg, he is requesting a call back from [place].

## 2020-10-02 NOTE — Telephone Encounter (Signed)
Left message on voice mail  to call back

## 2021-01-15 ENCOUNTER — Ambulatory Visit: Payer: 59 | Admitting: Adult Health Nurse Practitioner

## 2021-01-17 ENCOUNTER — Ambulatory Visit: Payer: 59 | Admitting: Adult Health

## 2021-01-31 ENCOUNTER — Ambulatory Visit: Payer: 59 | Admitting: Adult Health

## 2021-01-31 ENCOUNTER — Encounter: Payer: Self-pay | Admitting: Adult Health

## 2021-01-31 ENCOUNTER — Other Ambulatory Visit: Payer: Self-pay

## 2021-01-31 VITALS — BP 138/88 | HR 92 | Temp 98.1°F | Wt 163.0 lb

## 2021-01-31 DIAGNOSIS — E782 Mixed hyperlipidemia: Secondary | ICD-10-CM | POA: Diagnosis not present

## 2021-01-31 DIAGNOSIS — E559 Vitamin D deficiency, unspecified: Secondary | ICD-10-CM

## 2021-01-31 DIAGNOSIS — F10280 Alcohol dependence with alcohol-induced anxiety disorder: Secondary | ICD-10-CM | POA: Diagnosis not present

## 2021-01-31 DIAGNOSIS — Z720 Tobacco use: Secondary | ICD-10-CM

## 2021-01-31 DIAGNOSIS — R030 Elevated blood-pressure reading, without diagnosis of hypertension: Secondary | ICD-10-CM

## 2021-01-31 DIAGNOSIS — F3341 Major depressive disorder, recurrent, in partial remission: Secondary | ICD-10-CM | POA: Diagnosis not present

## 2021-01-31 DIAGNOSIS — Z79899 Other long term (current) drug therapy: Secondary | ICD-10-CM

## 2021-01-31 DIAGNOSIS — I10 Essential (primary) hypertension: Secondary | ICD-10-CM | POA: Insufficient documentation

## 2021-01-31 DIAGNOSIS — N529 Male erectile dysfunction, unspecified: Secondary | ICD-10-CM

## 2021-01-31 NOTE — Progress Notes (Signed)
FOLLOW UP  Assessment and Plan:  Mixed hyperlipidemia check lipids decrease fatty foods increase activity.   Depression, major, recurrent, in partial remission ([place]) Controlled at this time, likely related to drinking Declines medications  Tobacco abuse instruction/counseling given, counseled patient on the dangers of tobacco use, advised patient to stop smoking, and reviewed strategies to maximize success, patient not ready to quit at this time.   Erectile dysfunction, unspecified erectile dysfunction type Advised to stop drinking with aid of outpatient center and can help the ED -     tadalafil (CIALIS) 20 MG tablet; Take 1/2 tablet every other day or every 3 days for XXX and BPH  Low testosterone Has perceived benefits with supplementation, no SE. Check level in AM in 1 months,   Alcohol dependence with alcohol-induced anxiety disorder ([place]) He has successfully tapered with treatment center; back to drinking but lower quantity Discussed risks of excess alcohol intake Discussed slow taper; declined naltrexone at this time  Vitamin D def Check levels, supplement for goal 60-100  Elevated BP without diagnosis of hypertension Monitor blood pressure at home; call if consistently over 130/80 Continue DASH diet.   Reminder to go to the ER if any CP, SOB, nausea, dizziness, severe HA, changes vision/speech, left arm numbness and tingling and jaw pain.   Discussed med's effects and SE's. Screening labs and tests as requested with regular follow-up as recommended. Over 30 minutes of exam, counseling, chart review and critical decision making was performed Future Appointments  Date Time Provider [place]  07/21/2021  3:00 PM Robert Comber, NP [facility] None    HPI 55 y.o. presents for a 3 month follow up for depression, drinking, smoking, ED.   He states that he has had ED, tried viagra that helped some but he got some heart burn. Did well with cialsis but got the 5  mg and was taking daily but his insurance covers the 20 mg better. Will do 20mg , taking every 3-4 days.    He works for [employer]. He moved to [place] and in a larger house, things are better with him and his GF. He did not like the prozac or effexor, states he felt funny, maybe more emotional. He is doing well now.   He admits that he feels he is functional alcoholic, he reports checked into ED/rehab due to DTs and quit for a while but back to drinking. Less than previous, down from 20/day to 6 during the week and 12 on weekend days. Declines interventions.   He is back to smoking, does have vape and planning to taper to quit soon, just not ready.   His blood pressure has been controlled at home, today their BP is BP: 138/88 He does not workout. He denies chest pain, shortness of breath, dizziness.  BMI is Body mass index is 23.73 kg/m., he reports physically active job. Was doing lots of fast food but trying to cook more at home with girlfriend. Protein, veggies, starches.  Wt Readings from Last 3 Encounters:  01/31/21 163 lb (73.9 kg)  07/17/20 163 lb (73.9 kg)  05/24/20 161 lb 3.2 oz (73.1 kg)   He is not on cholesterol medication and denies myalgias. His cholesterol is at goal.  His maternal grandmother died of stroke.  The cholesterol last visit was:   Lab Results  Component Value Date   CHOL 197 07/17/2020   HDL 36 (L) 07/17/2020   LDLCALC 127 (H) 07/17/2020   TRIG 196 (H) 07/17/2020  CHOLHDL 5.5 (H) 07/17/2020    Last A1C in the office was:  Lab Results  Component Value Date   HGBA1C 5.4 07/06/2019   Last GFR: Lab Results  Component Value Date   GFRNONAA 105 07/17/2020   Patient is not currently on Vitamin D supplement.   Lab Results  Component Value Date   VD25OH 36 07/17/2020     He has a history of low testosterone, hasn't been low enough to supplement, but reports has been on testosterone and finds this is very beneficial for mood, libido,  energy/fatigue. Requesting recheck -   Lab Results  Component Value Date   TESTOSTERONE 287 07/17/2020      Current Medications:    Current Outpatient Medications (Cardiovascular):  .  tadalafil (CIALIS) 20 MG tablet, Take 20 mg by mouth daily as needed for erectile dysfunction.    Current Outpatient Medications (Hematological):  Marland Kitchen  Cyanocobalamin (VITAMIN B-12 PO), Take 5,000 mcg by mouth. (Patient not taking: Reported on 01/31/2021)  Current Outpatient Medications (Other):  Marland Kitchen  Multiple Vitamin (MULTI-VITAMIN PO), Take by mouth. (Patient not taking: Reported on 01/31/2021) .  UNABLE TO FIND, CBD oil (Patient not taking: Reported on 01/31/2021) .  zinc gluconate 50 MG tablet, Take 50 mg by mouth daily. (Patient not taking: Reported on 01/31/2021)  Allergies:  Allergies  Allergen Reactions  . Chantix [Varenicline Tartrate]     Suicidal ideation    Surgical History: reviewed and unchanged Family History: reviewed and unchanged Social History: reviewed and unchanged   Review of Systems:  Review of Systems  Constitutional: Positive for malaise/fatigue. Negative for weight loss.  HENT: Negative for hearing loss and tinnitus.   Eyes: Negative for blurred vision and double vision.  Respiratory: Negative for cough, shortness of breath and wheezing.   Cardiovascular: Negative for chest pain, palpitations, orthopnea, claudication and leg swelling.  Gastrointestinal: Negative for abdominal pain, blood in stool, constipation, diarrhea, heartburn, melena, nausea and vomiting.  Genitourinary: Negative.        Low libido, ED  Musculoskeletal: Negative for joint pain and myalgias.  Skin: Negative for rash.  Neurological: Negative for dizziness, tingling, sensory change, weakness and headaches.  Endo/Heme/Allergies: Negative for polydipsia.  Psychiatric/Behavioral: Negative.   All other systems reviewed and are negative.   Physical Exam: Estimated body mass index is 23.73 kg/m as  calculated from the following:   Height as of 07/17/20: 5' 9.5" (1.765 m).   Weight as of this encounter: 163 lb (73.9 kg). BP 138/88   Pulse (!) 110   Temp 98.1 F (36.7 C)   Wt 163 lb (73.9 kg)   SpO2 95%   BMI 23.73 kg/m  General Appearance: Well nourished, in no apparent distress.  Eyes: PERRLA, EOMs, conjunctiva no swelling or erythema, normal fundi and vessels.  Sinuses: No Frontal/maxillary tenderness  ENT/Mouth: Ext aud canals clear, normal light reflex with TMs without erythema, bulging. Good dentition. No erythema, swelling, or exudate on post pharynx. Tonsils not swollen or erythematous. Hearing normal.  Neck: Supple, thyroid normal. No bruits  Respiratory: Respiratory effort normal, BS equal bilaterally without rales, rhonchi, wheezing or stridor.  Cardio: RRR without murmurs, rubs or gallops. Brisk peripheral pulses without edema.  Chest: symmetric, with normal excursions and percussion.  Abdomen: Soft, nontender, no guarding, rebound, hernias, masses, or organomegaly.  Lymphatics: Non tender without lymphadenopathy.  Musculoskeletal: Full ROM all peripheral extremities,5/5 strength, and normal gait.  Skin: Warm, dry without rashes, lesions, ecchymosis. Generalized reddened skin tone, particularly in face.  Neuro: Cranial nerves intact, reflexes equal bilaterally. Normal muscle tone, no cerebellar symptoms. Sensation intact.  Psych: Awake and oriented X 3, normal affect, Insight and Judgment appropriate.   Robert Caldwell Robert Caldwell 9:08 AM [facility]

## 2021-01-31 NOTE — Patient Instructions (Addendum)
Goals    . DIET - EAT MORE FRUITS AND VEGETABLES    . Quit Smoking    . Reduce alcohol intake        Consider adding soluble fiber supplement - citrucel, benefiber, etc for diarrhea and cholesterol. Good food sources are black beans, old fashioned oats, chia seeds.     Testosterone Replacement Therapy Testosterone replacement therapy (TRT) treats men who have a low testosterone level. Testosterone is a male hormone that is produced in the testicles. It is responsible for typical male characteristics and for maintaining a man's sex drive and ability to get an erection. Testosterone also supports bone and muscle health. Low testosterone may not need to be treated. Your health care provider may recommend TRT if you have symptoms such as a low sex drive or erection problems, weak muscles or bones, or low energy. Types of TRT You and your health care provider will decide which form is best for you. TRT is available in the following forms:  Topical gels, creams, lotions, or sprays. Do not let other people, especially women or children, come in contact with the skin where the testosterone was applied.  Nasal gels.  Patches.  Pills.  Injections into the muscle or under the skin.  Long-acting pellets inserted under the skin. The amount of TRT you take and how long you take it is based on your condition. It is important to:  Begin TRT with the lowest possible dosage.  Stop TRT if your health care provider tells you to stop.  Work with your health care provider so that you feel informed and comfortable with your decision.   Tell a health care provider about:  Any allergies you have.  Any personal or family history of blood clots, breast cancer, or prostate cancer.  If you have sleep apnea or have been told that you snore.  All medicines you are taking, including vitamins, herbs, eye drops, creams, and over-the-counter medicines.  Any surgeries you have had.  Any other medical  conditions you have.  If you wish to have biological children someday. What are the benefits? Benefits of TRT vary but may include improved sexual function, muscle mass or strength, mood, or quality of life. What are the risks? Risks of TRT vary depending on your individual and medical history. Side effects can be related to the type of TRT you choose. If you choose products that are used on the skin, you may have skin irritation. If you get injections, you may have redness and swelling at the injection site. Side effects that can happen with any form of TRT include:  Lower sperm count.  Acne.  Swelling of your legs or feet, or tenderness in the chest or breast area.  Sleep disturbances and mood swings.  Enlarged prostate.  Increase in your red blood count. It is unclear if testosterone increases the risk of some serious conditions. Talk with your health care provider about your risk for these conditions, including:  Blood clots.  Heart disease, such as stroke or heart attack.  Prostate cancer. Risks of TRT may increase if you:  Have had or are at risk for prostate cancer or breast cancer.  Have had a previous stroke or heart attack.  Have a high number of red blood cells.  Have treated or untreated sleep apnea.  Have a large prostate. The long-term safety of TRT is not known. Follow these instructions at home:  Take over-the-counter and prescription medicines only as told by your health  care provider.  Lose weight if you are overweight. Ask your health care provider to help you start a healthy diet and exercise program to reach and maintain a healthy weight.  Work with your health care provider to manage other medical conditions that may lower your testosterone. These include obesity, high blood pressure, high cholesterol, diabetes, liver disease, kidney disease, and sleep apnea.  Do not use any testosterone replacement therapies that are not prescribed by your health  care provider.  Keep all follow-up visits. This is important. Where to find more information  [place]: www.urologyhealth.org  Endocrine Society: www.hormone.org Contact a health care provider if:  You have side effects from your TRT.  You have pain or swelling in your legs.  You have problems urinating.  You have lumps or changes in your breasts or armpits. Get help right away if:  You have shortness of breath.  You have chest pain.  You have slurred speech.  You have weakness or numbness in any part of your arms or legs. These symptoms may represent a serious problem that is an emergency. Do not wait to see if the symptoms will go away. Get medical help right away. Call your local emergency services (911 in the [place] Do not drive yourself to the hospital. Summary  Testosterone replacement therapy (TRT) is used to treat men who have a low testosterone level.  TRT should only be prescribed by and under the supervision of a health care provider.  Tell a health care provider about any medical conditions you have.  TRT may have side effects.  Talk with your health care provider about all of the risks and benefits before you start therapy. This information is not intended to replace advice given to you by your health care provider. Make sure you discuss any questions you have with your health care provider. Document Revised: 10/01/2020 Document Reviewed: 10/01/2020 Elsevier Patient Education  2021 [place].    Can do zinc 40-50 mg a day Can try shake that is whey protein, almond milk, avocado oil, and spinach and strawberries in the morning  9 Ways to Naturally Increase Testosterone Levels  1.   Lose Weight If you're overweight, shedding the excess pounds may increase your testosterone levels, according to research presented at the Endocrine Society's 2012 meeting. Overweight men are more likely to have low testosterone levels to begin with,  so this is an important trick to increase your body's testosterone production when you need it most.  2.   High-Intensity Exercise like Peak Fitness  Short intense exercise has a proven positive effect on increasing testosterone levels and preventing its decline. That's unlike aerobics or prolonged moderate exercise, which have shown to have negative or no effect on testosterone levels. Having a whey protein meal after exercise can further enhance the satiety/testosterone-boosting impact (hunger hormones cause the opposite effect on your testosterone and libido). Here's a summary of what a typical high-intensity Peak Fitness routine might look like: " Warm up for three minutes  " Exercise as hard and fast as you can for 30 seconds. You should feel like you couldn't possibly go on another few seconds  " Recover at a slow to moderate pace for 90 seconds  " Repeat the high intensity exercise and recovery 7 more times .  3.   Consume Plenty of Zinc The mineral zinc is important for testosterone production, and supplementing your diet for as little as six weeks has been shown to cause a marked improvement  in testosterone among men with low levels.1 Likewise, research has shown that restricting dietary sources of zinc leads to a significant decrease in testosterone, while zinc supplementation increases it2 -- and even protects men from exercised-induced reductions in testosterone levels.3 It's estimated that up to 44 percent of adults over the age of 60 may have lower than recommended zinc intakes; even when dietary supplements were added in, an estimated 20-25 percent of older adults still had inadequate zinc intakes, according to a [employer] and Nutrition Examination Survey.4 Your diet is the best source of zinc; along with protein-rich foods like meats and fish, other good dietary sources of zinc include raw milk, raw cheese, beans, and yogurt or kefir made from raw milk. It can be difficult to  obtain enough dietary zinc if you're a vegetarian, and also for meat-eaters as well, largely because of conventional farming methods that rely heavily on chemical fertilizers and pesticides. These chemicals deplete the soil of nutrients ... nutrients like zinc that must be absorbed by plants in order to be passed on to you. In many cases, you may further deplete the nutrients in your food by the way you prepare it. For most food, cooking it will drastically reduce its levels of nutrients like zinc ... particularly over-cooking, which many people do. If you decide to use a zinc supplement, stick to a dosage of less than 40 mg a day, as this is the recommended adult upper limit. Taking too much zinc can interfere with your body's ability to absorb other minerals, especially copper, and may cause nausea as a side effect.  4.   Strength Training In addition to Peak Fitness, strength training is also known to boost testosterone levels, provided you are doing so intensely enough. When strength training to boost testosterone, you'll want to increase the weight and lower your number of reps, and then focus on exercises that work a large number of muscles, such as dead lifts or squats.  You can "turbo-charge" your weight training by going slower. By slowing down your movement, you're actually turning it into a high-intensity exercise. Super Slow movement allows your muscle, at the microscopic level, to access the maximum number of cross-bridges between the protein filaments that produce movement in the muscle.   5.   Optimize Your Vitamin D Levels Vitamin D, a steroid hormone, is essential for the healthy development of the nucleus of the sperm cell, and helps maintain semen quality and sperm count. Vitamin D also increases levels of testosterone, which may boost libido. In one study, overweight men who were given vitamin D supplements had a significant increase in testosterone levels after one year.5   6.    Reduce Stress When you're under a lot of stress, your body releases high levels of the stress hormone cortisol. This hormone actually blocks the effects of testosterone,6 presumably because, from a biological standpoint, testosterone-associated behaviors (mating, competing, aggression) may have lowered your chances of survival in an emergency (hence, the "fight or flight" response is dominant, courtesy of cortisol).  7.   Limit or Eliminate Sugar from Your Diet Testosterone levels decrease after you eat sugar, which is likely because the sugar leads to a high insulin level, another factor leading to low testosterone.7 Based on USDA estimates, the average American consumes 12 teaspoons of sugar a day, which equates to about TWO TONS of sugar during a lifetime.  8.   Eat Healthy Fats By healthy, this means not only mon- and polyunsaturated fats, like that  found in avocadoes and nuts, but also saturated, as these are essential for building testosterone. Research shows that a diet with less than 40 percent of energy as fat (and that mainly from animal sources, i.e. saturated) lead to a decrease in testosterone levels.8 My personal diet is about 60-70 percent healthy fat, and other experts agree that the ideal diet includes somewhere between 50-70 percent fat.  It's important to understand that your body requires saturated fats from animal and vegetable sources (such as meat, dairy, certain oils, and tropical plants like coconut) for optimal functioning, and if you neglect this important food group in favor of sugar, grains and other starchy carbs, your health and weight are almost guaranteed to suffer. Examples of healthy fats you can eat more of to give your testosterone levels a boost include: Olives and Olive oil  Coconuts and coconut oil Butter made from raw grass-fed organic milk Raw nuts, such as, almonds or pecans Organic pastured egg yolks Avocados Grass-fed meats Palm oil Unheated organic nut  oils   9.   Boost Your Intake of Branch Chain Amino Acids (BCAA) from Foods Like [place] suggests that BCAAs result in higher testosterone levels, particularly when taken along with resistance training.9 While BCAAs are available in supplement form, you'll find the highest concentrations of BCAAs like leucine in dairy products - especially quality cheeses and whey protein. Even when getting leucine from your natural food supply, it's often wasted or used as a building block instead of an anabolic agent. So to create the correct anabolic environment, you need to boost leucine consumption way beyond mere maintenance levels. That said, keep in mind that using leucine as a free form amino acid can be highly counterproductive as when free form amino acids are artificially administrated, they rapidly enter your circulation while disrupting insulin function, and impairing your body's glycemic control. Food-based leucine is really the ideal form that can benefit your muscles without side effects.

## 2021-02-01 ENCOUNTER — Other Ambulatory Visit: Payer: Self-pay | Admitting: Adult Health

## 2021-02-01 DIAGNOSIS — D751 Secondary polycythemia: Secondary | ICD-10-CM

## 2021-02-01 LAB — CBC WITH DIFFERENTIAL/PLATELET
Absolute Monocytes: 445 cells/uL (ref 200–950)
Basophils Absolute: 51 cells/uL (ref 0–200)
Basophils Relative: 0.7 %
Eosinophils Absolute: 37 cells/uL (ref 15–500)
Eosinophils Relative: 0.5 %
HCT: 53.7 % — ABNORMAL HIGH (ref 38.5–50.0)
Hemoglobin: 18.8 g/dL — ABNORMAL HIGH (ref 13.2–17.1)
Lymphs Abs: 1278 cells/uL (ref 850–3900)
MCH: 32.8 pg (ref 27.0–33.0)
MCHC: 35 g/dL (ref 32.0–36.0)
MCV: 93.6 fL (ref 80.0–100.0)
MPV: 10.2 fL (ref 7.5–12.5)
Monocytes Relative: 6.1 %
Neutro Abs: 5490 cells/uL (ref 1500–7800)
Neutrophils Relative %: 75.2 %
Platelets: 173 10*3/uL (ref 140–400)
RBC: 5.74 10*6/uL (ref 4.20–5.80)
RDW: 13.5 % (ref 11.0–15.0)
Total Lymphocyte: 17.5 %
WBC: 7.3 10*3/uL (ref 3.8–10.8)

## 2021-02-01 LAB — COMPLETE METABOLIC PANEL WITH GFR
AG Ratio: 1.6 (calc) (ref 1.0–2.5)
ALT: 34 U/L (ref 9–46)
AST: 32 U/L (ref 10–35)
Albumin: 4.7 g/dL (ref 3.6–5.1)
Alkaline phosphatase (APISO): 86 U/L (ref 35–144)
BUN: 7 mg/dL (ref 7–25)
CO2: 26 mmol/L (ref 20–32)
Calcium: 9.5 mg/dL (ref 8.6–10.3)
Chloride: 102 mmol/L (ref 98–110)
Creat: 0.83 mg/dL (ref 0.70–1.33)
GFR, Est African American: 117 mL/min/{1.73_m2} (ref >=60)
GFR, Est Non African American: 101 mL/min/{1.73_m2} (ref >=60)
Globulin: 3 g/dL (calc) (ref 1.9–3.7)
Glucose, Bld: 91 mg/dL (ref 65–99)
Potassium: 4.5 mmol/L (ref 3.5–5.3)
Sodium: 138 mmol/L (ref 135–146)
Total Bilirubin: 0.5 mg/dL (ref 0.2–1.2)
Total Protein: 7.7 g/dL (ref 6.1–8.1)

## 2021-02-01 LAB — MAGNESIUM: Magnesium: 2.1 mg/dL (ref 1.5–2.5)

## 2021-02-01 LAB — VITAMIN D 25 HYDROXY (VIT D DEFICIENCY, FRACTURES): Vit D, 25-Hydroxy: 18 ng/mL — ABNORMAL LOW (ref 30–100)

## 2021-02-01 LAB — LIPID PANEL
Cholesterol: 222 mg/dL — ABNORMAL HIGH (ref <200)
HDL: 68 mg/dL (ref >=40)
LDL Cholesterol (Calc): 130 mg/dL (calc) — ABNORMAL HIGH
Non-HDL Cholesterol (Calc): 154 mg/dL (calc) — ABNORMAL HIGH (ref <130)
Total CHOL/HDL Ratio: 3.3 (calc) (ref <5.0)
Triglycerides: 129 mg/dL (ref <150)

## 2021-02-01 LAB — TSH: TSH: 1.11 mIU/L (ref 0.40–4.50)

## 2021-02-10 ENCOUNTER — Other Ambulatory Visit: Payer: Self-pay

## 2021-02-10 MED ORDER — TADALAFIL 20 MG PO TABS: 20.0000 mg | ORAL_TABLET | Freq: Every day | ORAL | 2 refills | Status: DC | PRN

## 2021-02-28 ENCOUNTER — Other Ambulatory Visit: Payer: 59

## 2021-02-28 ENCOUNTER — Other Ambulatory Visit: Payer: Self-pay

## 2021-02-28 DIAGNOSIS — N529 Male erectile dysfunction, unspecified: Secondary | ICD-10-CM

## 2021-02-28 DIAGNOSIS — D751 Secondary polycythemia: Secondary | ICD-10-CM

## 2021-02-28 LAB — TESTOSTERONE: Testosterone: 297 ng/dL (ref 250–827)

## 2021-02-28 LAB — FERRITIN: Ferritin: 188 ng/mL (ref 38–380)

## 2021-03-03 ENCOUNTER — Other Ambulatory Visit: Payer: Self-pay | Admitting: Adult Health

## 2021-03-03 MED ORDER — TESTOSTERONE CYPIONATE 200 MG/ML IM SOLN: 100.0000 mg | INTRAMUSCULAR | 0 refills | Status: DC

## 2021-03-06 ENCOUNTER — Other Ambulatory Visit: Payer: Self-pay

## 2021-03-06 MED ORDER — "BD ECLIPSE SYRINGE 21G X 1"" 3 ML MISC": 2 refills | Status: AC

## 2021-04-28 ENCOUNTER — Other Ambulatory Visit: Payer: Self-pay

## 2021-04-28 MED ORDER — TADALAFIL 20 MG PO TABS: 20.0000 mg | ORAL_TABLET | Freq: Every day | ORAL | 2 refills | Status: DC | PRN

## 2021-05-12 ENCOUNTER — Other Ambulatory Visit: Payer: Self-pay

## 2021-05-12 MED ORDER — TADALAFIL 20 MG PO TABS: 20.0000 mg | ORAL_TABLET | Freq: Every day | ORAL | 2 refills | Status: AC | PRN

## 2021-05-12 MED ORDER — TESTOSTERONE CYPIONATE 200 MG/ML IM SOLN: 100.0000 mg | INTRAMUSCULAR | 0 refills | Status: AC

## 2021-06-06 ENCOUNTER — Ambulatory Visit: Payer: 59 | Admitting: Adult Health

## 2021-06-27 ENCOUNTER — Ambulatory Visit: Payer: 59 | Admitting: Adult Health

## 2021-07-17 ENCOUNTER — Encounter: Payer: 59 | Admitting: Adult Health Nurse Practitioner

## 2021-07-18 DIAGNOSIS — Z6823 Body mass index (BMI) 23.0-23.9, adult: Secondary | ICD-10-CM | POA: Insufficient documentation

## 2021-07-18 DIAGNOSIS — Z86010 Personal history of colonic polyps: Secondary | ICD-10-CM | POA: Insufficient documentation

## 2021-07-18 DIAGNOSIS — R7989 Other specified abnormal findings of blood chemistry: Secondary | ICD-10-CM | POA: Insufficient documentation
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
## 2021-07-21 ENCOUNTER — Encounter: Payer: 59 | Admitting: Adult Health

## 2021-07-21 DIAGNOSIS — Z136 Encounter for screening for cardiovascular disorders: Secondary | ICD-10-CM

## 2021-07-21 DIAGNOSIS — Z131 Encounter for screening for diabetes mellitus: Secondary | ICD-10-CM

## 2021-07-21 DIAGNOSIS — E782 Mixed hyperlipidemia: Secondary | ICD-10-CM

## 2021-07-21 DIAGNOSIS — Z0001 Encounter for general adult medical examination with abnormal findings: Secondary | ICD-10-CM

## 2021-07-21 DIAGNOSIS — D751 Secondary polycythemia: Secondary | ICD-10-CM

## 2021-07-21 DIAGNOSIS — Z6823 Body mass index (BMI) 23.0-23.9, adult: Secondary | ICD-10-CM

## 2021-07-21 DIAGNOSIS — I10 Essential (primary) hypertension: Secondary | ICD-10-CM

## 2021-07-21 DIAGNOSIS — Z86010 Personal history of colonic polyps: Secondary | ICD-10-CM

## 2021-07-21 DIAGNOSIS — Z79899 Other long term (current) drug therapy: Secondary | ICD-10-CM

## 2021-07-21 DIAGNOSIS — Z1389 Encounter for screening for other disorder: Secondary | ICD-10-CM

## 2021-07-21 DIAGNOSIS — Z125 Encounter for screening for malignant neoplasm of prostate: Secondary | ICD-10-CM

## 2021-07-21 DIAGNOSIS — E559 Vitamin D deficiency, unspecified: Secondary | ICD-10-CM

## 2021-07-21 DIAGNOSIS — Z720 Tobacco use: Secondary | ICD-10-CM

## 2021-07-21 DIAGNOSIS — F10280 Alcohol dependence with alcohol-induced anxiety disorder: Secondary | ICD-10-CM

## 2021-07-21 DIAGNOSIS — N529 Male erectile dysfunction, unspecified: Secondary | ICD-10-CM

## 2021-07-21 DIAGNOSIS — F3341 Major depressive disorder, recurrent, in partial remission: Secondary | ICD-10-CM

## 2021-07-21 DIAGNOSIS — R7989 Other specified abnormal findings of blood chemistry: Secondary | ICD-10-CM

## 2021-07-21 DIAGNOSIS — Z1329 Encounter for screening for other suspected endocrine disorder: Secondary | ICD-10-CM

## 2022-02-24 ENCOUNTER — Other Ambulatory Visit: Payer: Self-pay | Admitting: Nurse Practitioner

## 2022-02-24 ENCOUNTER — Other Ambulatory Visit: Payer: Self-pay

## 2022-02-24 ENCOUNTER — Ambulatory Visit
Admission: RE | Admit: 2022-02-24 | Discharge: 2022-02-24 | Disposition: A | Discharge status: Home / Self Care | Payer: Worker's Compensation | Source: Ambulatory Visit | Attending: Nurse Practitioner | Admitting: Nurse Practitioner

## 2022-02-24 DIAGNOSIS — M25512 Pain in left shoulder: Secondary | ICD-10-CM

## 2022-07-21 ENCOUNTER — Encounter: Payer: 59 | Admitting: Nurse Practitioner

## 2022-09-08 ENCOUNTER — Encounter: Payer: Self-pay | Admitting: Gastroenterology

## 2023-01-20 ENCOUNTER — Ambulatory Visit: Payer: 59 | Admitting: Nurse Practitioner

## 2023-01-20 ENCOUNTER — Encounter: Payer: Self-pay | Admitting: Nurse Practitioner

## 2023-01-20 VITALS — BP 168/98 | HR 91 | Temp 97.5°F | Ht 69.0 in | Wt 177.0 lb

## 2023-01-20 DIAGNOSIS — Z7251 High risk heterosexual behavior: Secondary | ICD-10-CM | POA: Diagnosis not present

## 2023-01-20 DIAGNOSIS — R030 Elevated blood-pressure reading, without diagnosis of hypertension: Secondary | ICD-10-CM | POA: Diagnosis not present

## 2023-01-20 NOTE — Patient Instructions (Signed)
Safe Sex Practicing safe sex means taking steps before and during sex to reduce your risk of: Getting an STI (sexually transmitted infection). Giving your partner an STI. Unwanted or unplanned pregnancy. How to practice safe sex Ways you can practice safe sex  Limit your sexual partners to only one partner who is having sex with only you. Avoid using alcohol and drugs before having sex. Alcohol and drugs can affect your judgment. Before having sex with a new partner: Talk to your partner about past partners, past STIs, and drug use. Get screened for STIs and discuss the results with your partner. Ask your partner to get screened too. Check your body regularly for sores, blisters, rashes, or unusual discharge. If you notice any of these problems, visit your health care provider. Avoid sexual contact if you have symptoms of an infection or you are being treated for an STI. While having sex, use a condom. Make sure to: Use a condom every time you have vaginal, oral, or anal sex. Both females and males should wear condoms during oral sex. Keep condoms in place from the beginning to the end of sexual activity. Use a latex condom, if possible. Latex condoms offer the best protection. Use only water-based lubricants with a condom. Using petroleum-based lubricants or oils will weaken the condom and increase the chance that it will break. Ways your health care provider can help you practice safe sex  See your health care provider for regular screenings, exams, and tests for STIs. Talk with your health care provider about what kind of birth control (contraception) is best for you. Get vaccinated against hepatitis B and human papillomavirus (HPV). If you are at risk of being infected with HIV (human immunodeficiency virus), talk with your health care provider about taking a prescription medicine to prevent HIV infection. You are at risk for HIV if you: Are a man who has sex with other men. Are  sexually active with more than one partner. Take drugs by injection. Have a sex partner who has HIV. Have unprotected sex. Have sex with someone who has sex with both men and women. Have had an STI. Follow these instructions at home: Take over-the-counter and prescription medicines only as told by your health care provider. Keep all follow-up visits. This is important. Where to find more information Centers for Disease Control and Prevention: www.cdc.gov Planned Parenthood: www.plannedparenthood.org Office on Women's Health: www.womenshealth.gov Summary Practicing safe sex means taking steps before and during sex to reduce your risk getting an STI, giving your partner an STI, and having an unwanted or unplanned pregnancy. Before having sex with a new partner, talk to your partner about past partners, past STIs, and drug use. Use a condom every time you have vaginal, oral, or anal sex. Both females and males should wear condoms during oral sex. Check your body regularly for sores, blisters, rashes, or unusual discharge. If you notice any of these problems, visit your health care provider. See your health care provider for regular screenings, exams, and tests for STIs. This information is not intended to replace advice given to you by your health care provider. Make sure you discuss any questions you have with your health care provider. Document Revised: 05/13/2020 Document Reviewed: 05/13/2020 Elsevier Patient Education  2023 Elsevier Inc.  

## 2023-01-20 NOTE — Progress Notes (Signed)
Assessment and Plan:  Robert Caldwell was seen today for an episodic visit.  Diagnoses and all order for this visit:  High risk sexual behavior, unspecified type Discussed safe sex practices   - RPR - HIV Antibody (routine testing w rflx) - HSV(herpes simplex vrs) 1+2 ab-IgG - C. trachomatis/N. gonorrhoeae RNA - Trichomonas vaginalis RNA, Ql,Males  Elevated BP without dx of HTN Likely acute and r/t possible STD exposure - asymptomatic Report to ER for any increase in stroke like symptoms, including HA, N/V, paralysis, difficulty speaking, trouble walking, confusion, vision changes, CP, heart palpitations, SOB, diaphoresis. RTC in 1 mo for annual physical  Notify office for further evaluation and treatment, questions or concerns if s/s fail to improve. The risks and benefits of my recommendations, as well as other treatment options were discussed with the patient today. Questions were answered.  Further disposition pending results of labs. Discussed med's effects and SE's.    Over 20 minutes of exam, counseling, chart review, and critical decision making was performed.   Future Appointments  Date Time Provider [place]  07/22/2023  3:00 PM Athan Casalino, Kenney Houseman, NP [facility] None    ------------------------------------------------------------------------------------------------------------------   HPI BP (!) 168/98   Pulse 91   Temp (!) 97.5 F (36.4 C)   Ht '5\' 9"'$  (1.753 m)   Wt 177 lb (80.3 kg)   SpO2 94%   BMI 26.14 kg/m   57 y.o.male presents for STD screening.  Has not been treated in our office since 01/2021.    Reports having a recent sexual encounter with a male partner who advised him that she tested positive for HSV1 and HSV2.  He reports he is currently asymptomatic.  Patient is also asymptomatic.  Denies penile rash, discharge, abdominal pain, fever, chills, N/V.  Reports currently have 3 sexual partners.    BP is elevated in clinic today.  He does not  have a dx of HTN, feels it is r/t stress of possibly having an STD. He denies CP, heart palpitations, SOB.  States BP is usually well controlled.  BP Readings from Last 3 Encounters:  01/20/23 (!) 168/98  01/31/21 138/88  07/17/20 124/70   Past Medical History:  Diagnosis Date   Anxiety    Depression    Mixed hyperlipidemia 01/18/2020     Allergies  Allergen Reactions   Chantix [Varenicline Tartrate]     Suicidal ideation    Current Outpatient Medications on File Prior to Visit  Medication Sig   Cyanocobalamin (VITAMIN B-12 PO) Take 5,000 mcg by mouth. (Patient not taking: Reported on 01/31/2021)   Multiple Vitamin (MULTI-VITAMIN PO) Take by mouth. (Patient not taking: Reported on 01/31/2021)   SYRINGE-NEEDLE, DISP, 3 ML (BD ECLIPSE SYRINGE) 21G X 1" 3 ML MISC Use to inject 0.36ms ('100mg'$  total) into the muscle once a week (Patient not taking: Reported on 01/20/2023)   tadalafil (CIALIS) 20 MG tablet Take 1 tablet (20 mg total) by mouth daily as needed for erectile dysfunction. (Patient not taking: Reported on 01/20/2023)   testosterone cypionate (DEPOTESTOSTERONE CYPIONATE) 200 MG/ML injection Inject 0.5 mLs (100 mg total) into the muscle once a week. (Patient not taking: Reported on 01/20/2023)   UNABLE TO FIND CBD oil (Patient not taking: Reported on 01/31/2021)   zinc gluconate 50 MG tablet Take 50 mg by mouth daily. (Patient not taking: Reported on 01/31/2021)   No current facility-administered medications on file prior to visit.    ROS: all negative except what is noted in the HPI.  Physical Exam:  BP (!) 168/98   Pulse 91   Temp (!) 97.5 F (36.4 C)   Ht '5\' 9"'$  (1.753 m)   Wt 177 lb (80.3 kg)   SpO2 94%   BMI 26.14 kg/m   General Appearance: NAD.  Awake, conversant and cooperative. Eyes: PERRLA, EOMs intact.  Sclera white.  Conjunctiva without erythema. Sinuses: No frontal/maxillary tenderness.  No nasal discharge. Nares patent.  ENT/Mouth: Ext aud canals clear.   Bilateral TMs w/DOL and without erythema or bulging. Hearing intact.  Posterior pharynx without swelling or exudate.  Tonsils without swelling or erythema.  Neck: Supple.  No masses, nodules or thyromegaly. Respiratory: Effort is regular with non-labored breathing. Breath sounds are equal bilaterally without rales, rhonchi, wheezing or stridor.  Cardio: RRR with no MRGs. Brisk peripheral pulses without edema.  Abdomen: Active BS in all four quadrants.  Soft and non-tender without guarding, rebound tenderness, hernias or masses. Lymphatics: Non tender without lymphadenopathy.  Musculoskeletal: Full ROM, 5/5 strength, normal ambulation.  No clubbing or cyanosis. Skin: Appropriate color for ethnicity. Warm without rashes, lesions, ecchymosis, ulcers.  Neuro: CN II-XII grossly normal. Normal muscle tone without cerebellar symptoms and intact sensation.   Psych: AO X 3,  appropriate mood and affect, insight and judgment.     Darrol Jump, NP 3:49 PM [facility]

## 2023-01-21 LAB — TRICHOMONAS VAGINALIS RNA, QL,MALES: Trichomonas vaginalis RNA: NOT DETECTED

## 2023-01-21 LAB — HSV(HERPES SIMPLEX VRS) I + II AB-IGG
HAV 1 IGG,TYPE SPECIFIC AB: <0.9 index
HSV 2 IGG,TYPE SPECIFIC AB: <0.9 index

## 2023-01-21 LAB — C. TRACHOMATIS/N. GONORRHOEAE RNA
C. trachomatis RNA, TMA: NOT DETECTED
N. gonorrhoeae RNA, TMA: NOT DETECTED

## 2023-01-21 LAB — HIV ANTIBODY (ROUTINE TESTING W REFLEX): HIV 1&2 Ab, 4th Generation: NONREACTIVE

## 2023-01-21 LAB — RPR: RPR Ser Ql: NONREACTIVE

## 2023-01-28 NOTE — Progress Notes (Signed)
Assessment and Plan:  Diagnoses and all orders for this visit:  Flu-like symptoms -     POCT Influenza A/B- neg  Encounter for screening for COVID-19 -     POC COVID-19- neg  Tobacco abuse Encouraged to cut down and quit  Sore throat Leaning towards mono infection due to exam- written out of work for next week If unable to swallow, develop high fever, abdominal- go to the ER -     Epstein-Barr virus VCA antibody panel -     POCT rapid strep A -     CBC with Differential/Platelet -     COMPLETE METABOLIC PANEL WITH GFR -     dexamethasone (DECADRON) 4 MG tablet; Take 3 tabs for 3 days, 2 tabs for 3 days 1 tab for 5 days. Take with food.  Medication management -     CBC with Differential/Platelet -     COMPLETE METABOLIC PANEL WITH GFR         Further disposition pending results of labs. Discussed med's effects and SE's.   Over 30 minutes of exam, counseling, chart review, and critical decision making was performed.   Future Appointments  Date Time Provider [place]  02/26/2023 10:00 AM [place], Kenney Houseman, NP [facility] None    ------------------------------------------------------------------------------------------------------------------   HPI BP (!) 138/92   Pulse (!) 106   Temp 97.9 F (36.6 C)   Ht 5' 9"$  (1.753 m)   Wt 161 lb 12.8 oz (73.4 kg)   SpO2 96%   BMI 23.89 kg/m   57 y.o.male presents for sore throat, fever, neck swollen and ear pain on his left side that started 5-6 days ago. He has also been having diarrhea but that has resolved.   He does smoke 1 ppd. Denies coughing currently. He is not interested in d/c smoking currently.   Past Medical History:  Diagnosis Date   Anxiety    Depression    Mixed hyperlipidemia 01/18/2020     Allergies  Allergen Reactions   Chantix [Varenicline Tartrate]     Suicidal ideation    Current Outpatient Medications on File Prior to Visit  Medication Sig   IBUPROFEN PO Take by mouth.    SYRINGE-NEEDLE, DISP, 3 ML (BD ECLIPSE SYRINGE) 21G X 1" 3 ML MISC Use to inject 0.16ms (1057mtotal) into the muscle once a week   tadalafil (CIALIS) 20 MG tablet Take 1 tablet (20 mg total) by mouth daily as needed for erectile dysfunction.   testosterone cypionate (DEPOTESTOSTERONE CYPIONATE) 200 MG/ML injection Inject 0.5 mLs (100 mg total) into the muscle once a week.   Cyanocobalamin (VITAMIN B-12 PO) Take 5,000 mcg by mouth. (Patient not taking: Reported on 01/31/2021)   Multiple Vitamin (MULTI-VITAMIN PO) Take by mouth. (Patient not taking: Reported on 01/31/2021)   UNABLE TO FIND CBD oil (Patient not taking: Reported on 01/31/2021)   zinc gluconate 50 MG tablet Take 50 mg by mouth daily. (Patient not taking: Reported on 01/31/2021)   No current facility-administered medications on file prior to visit.    ROS: all negative except above.   Physical Exam:  BP (!) 138/92   Pulse (!) 106   Temp 97.9 F (36.6 C)   Ht 5' 9"$  (1.753 m)   Wt 161 lb 12.8 oz (73.4 kg)   SpO2 96%   BMI 23.89 kg/m   General Appearance: Thin pleasant male, appears ill.  Eyes: PERRLA, EOMs, conjunctiva no swelling or erythema Sinuses: No Frontal/maxillary tenderness ENT/Mouth: Ext aud canals clear,  TMs without erythema, bulging- dull. Erythema and swelling of tonsils, no exudate . Hearing normal.  Neck: Supple, thyroid normal.  Respiratory: Respiratory effort normal, BS equal bilaterally without rales, rhonchi, wheezing or stridor.  Cardio: RRR with no MRGs. Brisk peripheral pulses without edema.  Abdomen: Soft, + BS.  Non tender, no guarding, rebound, hernias, masses. Lymphatics: Large, tender left submandibular adenopathy Musculoskeletal: Full ROM, 5/5 strength, normal gait.  Skin: Warm, dry without rashes, lesions, ecchymosis.  Neuro: Cranial nerves intact. Normal muscle tone, no cerebellar symptoms. Sensation intact.  Psych: Awake and oriented X 3, normal affect, Insight and Judgment appropriate.      Alycia Rossetti, NP 11:09 AM [facility]

## 2023-01-29 ENCOUNTER — Encounter: Payer: Self-pay | Admitting: Nurse Practitioner

## 2023-01-29 ENCOUNTER — Other Ambulatory Visit: Payer: Self-pay

## 2023-01-29 ENCOUNTER — Ambulatory Visit (INDEPENDENT_AMBULATORY_CARE_PROVIDER_SITE_OTHER): Payer: 59 | Admitting: Nurse Practitioner

## 2023-01-29 VITALS — BP 138/92 | HR 106 | Temp 97.9°F | Ht 69.0 in | Wt 161.8 lb

## 2023-01-29 DIAGNOSIS — Z1152 Encounter for screening for COVID-19: Secondary | ICD-10-CM

## 2023-01-29 DIAGNOSIS — J029 Acute pharyngitis, unspecified: Secondary | ICD-10-CM | POA: Diagnosis not present

## 2023-01-29 DIAGNOSIS — R6889 Other general symptoms and signs: Secondary | ICD-10-CM | POA: Diagnosis not present

## 2023-01-29 DIAGNOSIS — Z79899 Other long term (current) drug therapy: Secondary | ICD-10-CM | POA: Diagnosis not present

## 2023-01-29 DIAGNOSIS — Z720 Tobacco use: Secondary | ICD-10-CM

## 2023-01-29 LAB — POCT RAPID STREP A (OFFICE): Rapid Strep A Screen: NEGATIVE

## 2023-01-29 LAB — POCT INFLUENZA A/B
Influenza A, POC: NEGATIVE
Influenza B, POC: NEGATIVE

## 2023-01-29 LAB — POC COVID19 BINAXNOW: SARS Coronavirus 2 Ag: NEGATIVE

## 2023-01-29 MED ORDER — DEXAMETHASONE 4 MG PO TABS: ORAL_TABLET | ORAL | 0 refills | Status: DC

## 2023-01-29 NOTE — Patient Instructions (Signed)
Infectious Mononucleosis Infectious mononucleosis is an infection that is caused by a virus. This illness is often called "mono." It can spread from person to person. Mono is usually not serious. It often goes away in 2-4 weeks without treatment. In rare cases, the illness can become bad and last longer. What are the causes? This condition is caused by the Epstein-Barr virus. This virus spreads through: Contact with a sick person's saliva or other body fluids. This can happen through: Kissing. Sex. Coughing. Sneezing. Sharing forks, spoons, knives, or drinking glasses with a person who is sick. Receiving blood from a person who has mono. Receiving an organ from a person who has mono. What increases the risk? You are more likely to develop this condition if: You are 47-75 years old. What are the signs or symptoms? Common symptoms include: Sore throat. Headache. Being very tired (fatigued). Pain in the muscles. Swollen glands. Fever. No desire for food. Rash. Other symptoms include: A liver or spleen that is larger than normal. Feeling like you may vomit. Vomiting. Pain in the belly (abdomen). How is this treated? There is no cure for this condition. Mono usually goes away on its own with time. Treatment can help relieve symptoms and may include: Taking medicines, including medicines to treat swelling. Drinking plenty of fluids. Getting a lot of rest. Follow these instructions at home: Medicines Take over-the-counter and prescription medicines only as told by your doctor. Do not take ampicillin or amoxicillin. This may cause a rash. Do not take aspirin if you are under 18. Activity Rest as needed. Do not do any of the following activities until your doctor says that they are safe for you: Contact sports. You may need to wait at least 1 month before you play sports. Exercise that uses a lot of energy. Lifting heavy things. Slowly go back to your normal activities after your  fever is gone, or when your doctor says that you can. Be sure to rest when you get tired. General instructions  Avoid kissing or sharing forks, spoons, knives, or drinking cups until your doctor says that you can. Drink enough fluid to keep your pee (urine) pale yellow. Do not drink alcohol. If you have a sore throat: Rinse your mouth often with salt water. To make salt water, dissolve -1 tsp (3-6 g) of salt in 1 cup (237 mL) of warm water. Eat soft foods. Cold foods such as ice cream or ice pops can help your throat feel better. Try sucking on hard candy. Keep all follow-up visits. How is this prevented?  Avoid contact with people who have mono. A person who has mono may not seem sick, but he or she can still spread the virus. Avoid sharing forks, spoons, knives, drinking cups, or toothbrushes. Wash your hands often for at least 20 seconds with soap and water. If you cannot use soap and water, use hand sanitizer. Use the inside of your elbow to cover your mouth when you cough or sneeze. Where to find more information Centers for Disease Control and Prevention: http://www.wolf.info/ Contact a doctor if: Your fever is not gone after 10 days. You have swelling by your jaw or neck, and the swelling does not go away after 4 weeks. Your activity level is not back to normal after 2 months. Your skin or the white parts of your eyes turn yellow (jaundice). You have trouble pooping (constipation). You may have constipation if: You poop fewer times in a week than normal. You have a hard time  pooping. You have poop that is dry, hard, or bigger than normal. Get help right away if: You have very bad pain in your: Belly. Shoulder. You are drooling. You have trouble swallowing. You have trouble breathing. You have a stiff neck. You have a very bad headache. You cannot stop throwing up. You have jerky movements that you cannot control (seizures). You are mixed up (confused). You have trouble with  balance. Your nose or gums start to bleed. You have signs of not having enough water in your body (dehydration). These may include: Weakness. Sunken eyes. Pale skin. Dry mouth. Fast breathing or heartbeat. These symptoms may be an emergency. Get help right away. Call your local emergency services (911 in the [place] Do not wait to see if the symptoms will go away. Do not drive yourself to the hospital. Summary Infectious mononucleosis, or "mono," is an infection that is caused by a virus. Mono is usually not serious, but some people may need to be treated for it in the hospital. You should not play contact sports or lift heavy things until your doctor says that you can. Wash your hands often for at least 20 seconds with soap and water. If you cannot use soap and water, use hand sanitizer. This information is not intended to replace advice given to you by your health care provider. Make sure you discuss any questions you have with your health care provider. Document Revised: 11/22/2020 Document Reviewed: 11/22/2020 Elsevier Patient Education  [employer].

## 2023-01-30 LAB — COMPLETE METABOLIC PANEL WITH GFR
AG Ratio: 1.3 (calc) (ref 1.0–2.5)
ALT: 49 U/L — ABNORMAL HIGH (ref 9–46)
AST: 37 U/L — ABNORMAL HIGH (ref 10–35)
Albumin: 4.3 g/dL (ref 3.6–5.1)
Alkaline phosphatase (APISO): 80 U/L (ref 35–144)
BUN/Creatinine Ratio: 7 (calc) (ref 6–22)
BUN: 5 mg/dL — ABNORMAL LOW (ref 7–25)
CO2: 26 mmol/L (ref 20–32)
Calcium: 9 mg/dL (ref 8.6–10.3)
Chloride: 96 mmol/L — ABNORMAL LOW (ref 98–110)
Creat: 0.72 mg/dL (ref 0.70–1.30)
Globulin: 3.2 g/dL (calc) (ref 1.9–3.7)
Glucose, Bld: 108 mg/dL — ABNORMAL HIGH (ref 65–99)
Potassium: 4.6 mmol/L (ref 3.5–5.3)
Sodium: 134 mmol/L — ABNORMAL LOW (ref 135–146)
Total Bilirubin: 0.5 mg/dL (ref 0.2–1.2)
Total Protein: 7.5 g/dL (ref 6.1–8.1)
eGFR: 109 mL/min/{1.73_m2} (ref >=60)

## 2023-01-30 LAB — EPSTEIN-BARR VIRUS VCA ANTIBODY PANEL
EBV NA IgG: 23.1 U/mL — ABNORMAL HIGH
EBV VCA IgG: >750 U/mL — ABNORMAL HIGH
EBV VCA IgM: <36 U/mL

## 2023-01-30 LAB — CBC WITH DIFFERENTIAL/PLATELET
Absolute Monocytes: 779 cells/uL (ref 200–950)
Basophils Absolute: 59 cells/uL (ref 0–200)
Basophils Relative: 0.9 %
Eosinophils Absolute: 33 cells/uL (ref 15–500)
Eosinophils Relative: 0.5 %
HCT: 54 % — ABNORMAL HIGH (ref 38.5–50.0)
Hemoglobin: 19.6 g/dL — ABNORMAL HIGH (ref 13.2–17.1)
Lymphs Abs: 1135 cells/uL (ref 850–3900)
MCH: 33.2 pg — ABNORMAL HIGH (ref 27.0–33.0)
MCHC: 36.3 g/dL — ABNORMAL HIGH (ref 32.0–36.0)
MCV: 91.5 fL (ref 80.0–100.0)
MPV: 10.9 fL (ref 7.5–12.5)
Monocytes Relative: 11.8 %
Neutro Abs: 4594 cells/uL (ref 1500–7800)
Neutrophils Relative %: 69.6 %
Platelets: 110 10*3/uL — ABNORMAL LOW (ref 140–400)
RBC: 5.9 10*6/uL — ABNORMAL HIGH (ref 4.20–5.80)
RDW: 13.2 % (ref 11.0–15.0)
Total Lymphocyte: 17.2 %
WBC: 6.6 10*3/uL (ref 3.8–10.8)

## 2023-02-26 ENCOUNTER — Encounter: Payer: 59 | Admitting: Nurse Practitioner

## 2023-04-16 ENCOUNTER — Ambulatory Visit (INDEPENDENT_AMBULATORY_CARE_PROVIDER_SITE_OTHER): Payer: 59 | Admitting: Nurse Practitioner

## 2023-04-16 ENCOUNTER — Encounter: Payer: Self-pay | Admitting: Nurse Practitioner

## 2023-04-16 VITALS — BP 160/92 | HR 91 | Temp 98.8°F | Ht 69.0 in | Wt 164.4 lb

## 2023-04-16 DIAGNOSIS — Z136 Encounter for screening for cardiovascular disorders: Secondary | ICD-10-CM

## 2023-04-16 DIAGNOSIS — Z720 Tobacco use: Secondary | ICD-10-CM

## 2023-04-16 DIAGNOSIS — Z79899 Other long term (current) drug therapy: Secondary | ICD-10-CM

## 2023-04-16 DIAGNOSIS — Z131 Encounter for screening for diabetes mellitus: Secondary | ICD-10-CM

## 2023-04-16 DIAGNOSIS — R030 Elevated blood-pressure reading, without diagnosis of hypertension: Secondary | ICD-10-CM

## 2023-04-16 DIAGNOSIS — E559 Vitamin D deficiency, unspecified: Secondary | ICD-10-CM

## 2023-04-16 DIAGNOSIS — Z1329 Encounter for screening for other suspected endocrine disorder: Secondary | ICD-10-CM

## 2023-04-16 DIAGNOSIS — Z860101 Personal history of adenomatous and serrated colon polyps: Secondary | ICD-10-CM

## 2023-04-16 DIAGNOSIS — I10 Essential (primary) hypertension: Secondary | ICD-10-CM | POA: Diagnosis not present

## 2023-04-16 DIAGNOSIS — Z1211 Encounter for screening for malignant neoplasm of colon: Secondary | ICD-10-CM

## 2023-04-16 DIAGNOSIS — Z86010 Personal history of colonic polyps: Secondary | ICD-10-CM

## 2023-04-16 DIAGNOSIS — R748 Abnormal levels of other serum enzymes: Secondary | ICD-10-CM

## 2023-04-16 DIAGNOSIS — Z1389 Encounter for screening for other disorder: Secondary | ICD-10-CM

## 2023-04-16 DIAGNOSIS — E538 Deficiency of other specified B group vitamins: Secondary | ICD-10-CM

## 2023-04-16 DIAGNOSIS — Z0000 Encounter for general adult medical examination without abnormal findings: Secondary | ICD-10-CM | POA: Diagnosis not present

## 2023-04-16 DIAGNOSIS — Z0001 Encounter for general adult medical examination with abnormal findings: Secondary | ICD-10-CM

## 2023-04-16 DIAGNOSIS — Z125 Encounter for screening for malignant neoplasm of prostate: Secondary | ICD-10-CM

## 2023-04-16 DIAGNOSIS — N529 Male erectile dysfunction, unspecified: Secondary | ICD-10-CM

## 2023-04-16 DIAGNOSIS — E782 Mixed hyperlipidemia: Secondary | ICD-10-CM

## 2023-04-16 NOTE — Patient Instructions (Signed)
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
## 2023-04-26 ENCOUNTER — Other Ambulatory Visit: Payer: Self-pay | Admitting: Nurse Practitioner

## 2023-04-26 DIAGNOSIS — R030 Elevated blood-pressure reading, without diagnosis of hypertension: Secondary | ICD-10-CM

## 2023-04-26 DIAGNOSIS — Z0001 Encounter for general adult medical examination with abnormal findings: Secondary | ICD-10-CM

## 2023-04-26 DIAGNOSIS — Z720 Tobacco use: Secondary | ICD-10-CM

## 2023-07-22 ENCOUNTER — Encounter: Payer: 59 | Admitting: Nurse Practitioner

## 2023-08-17 IMAGING — CR DG SHOULDER 2+V*L*
3 series · 3 of 3 positions shown · non-contrast
Comparison: None.

CLINICAL DATA: Left shoulder pain. Question acute injury. Patient
reports feeling a pull in left shoulder while loading a pole saw
onto truck yesterday, progressive pain.

EXAM:
LEFT SHOULDER - 2+ VIEW

[w shoulder grashey left]
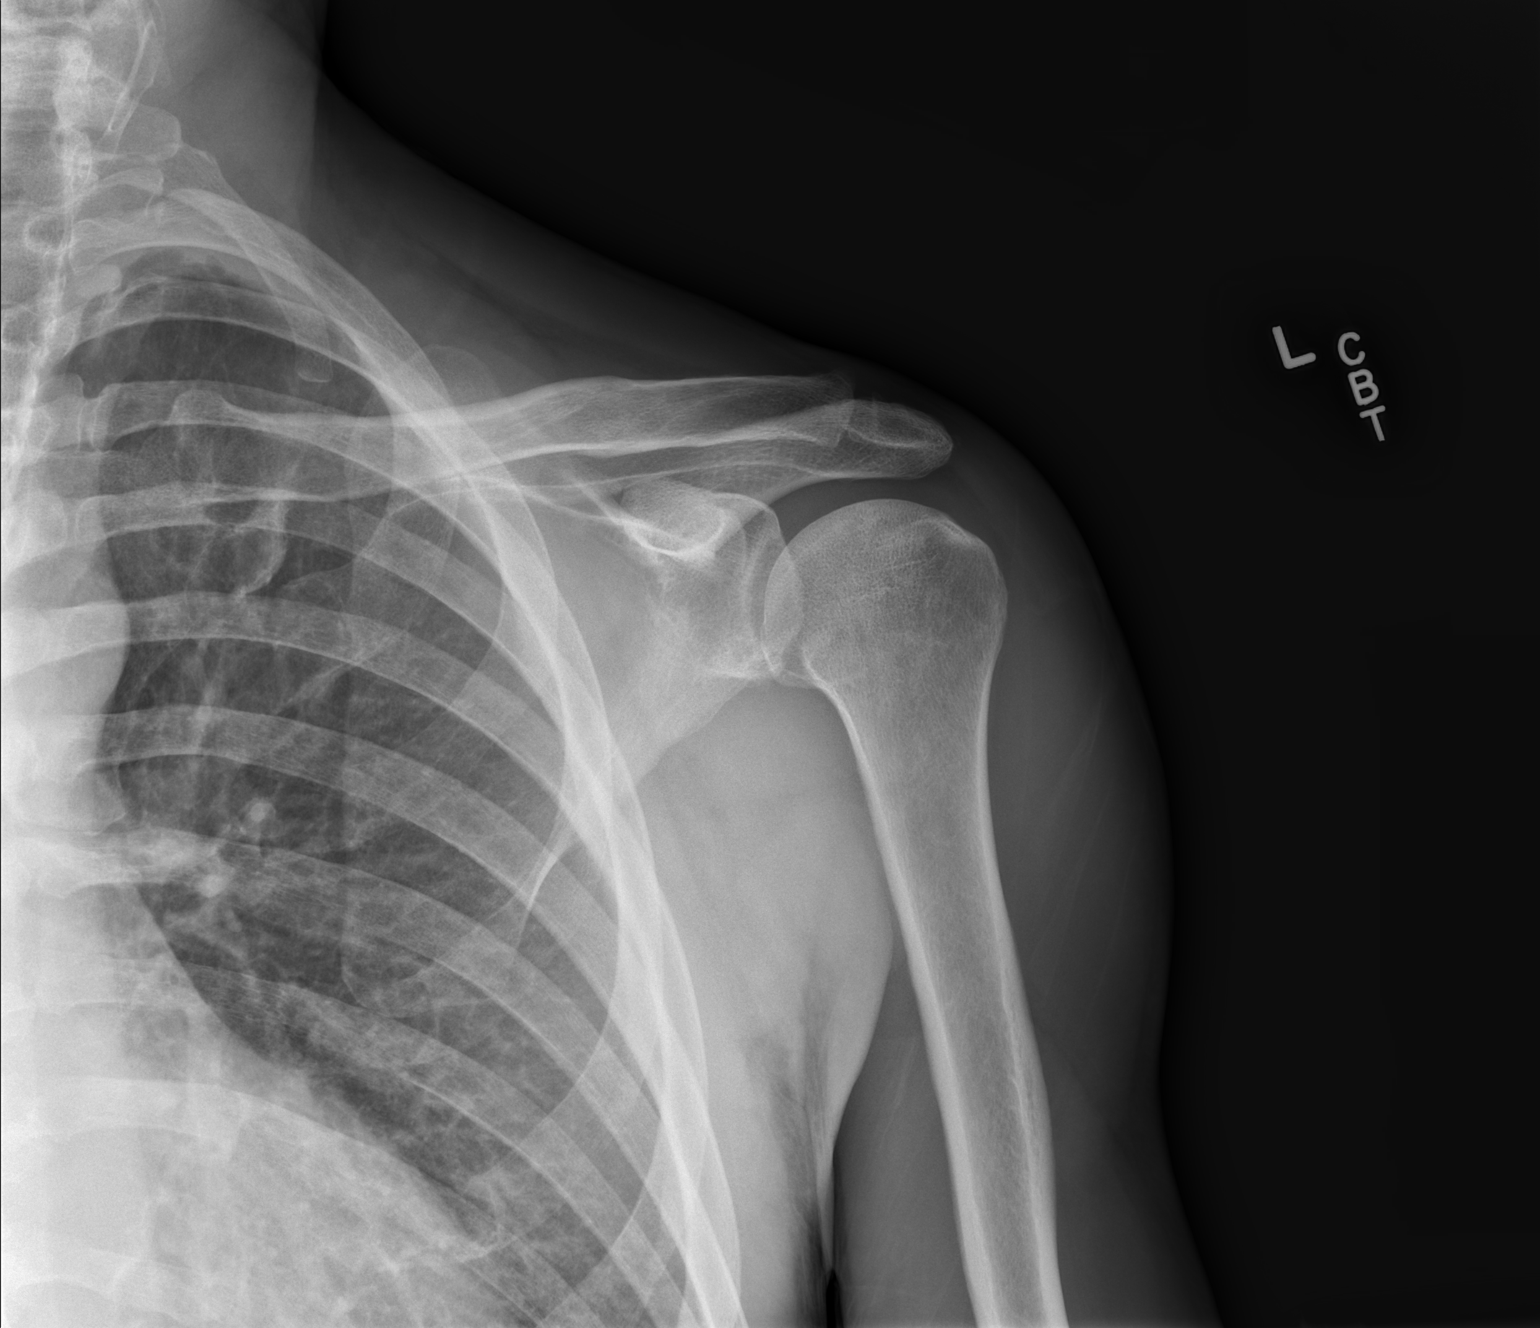

[w shoulder y-view left]
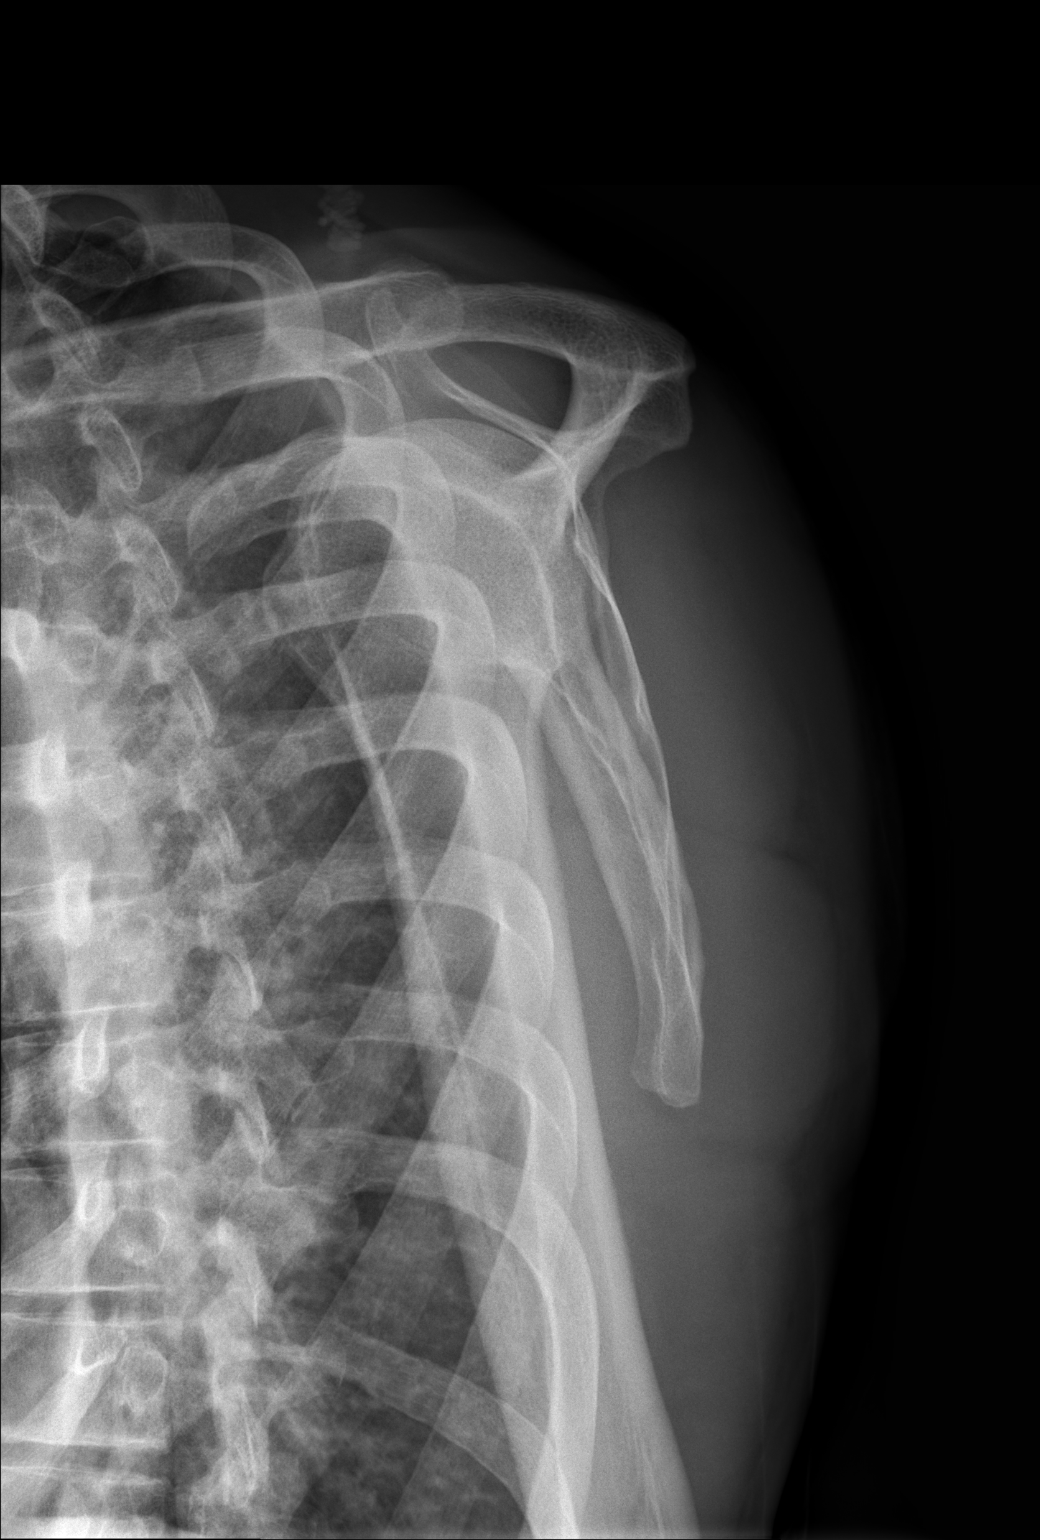

[x shoulder axillary left]
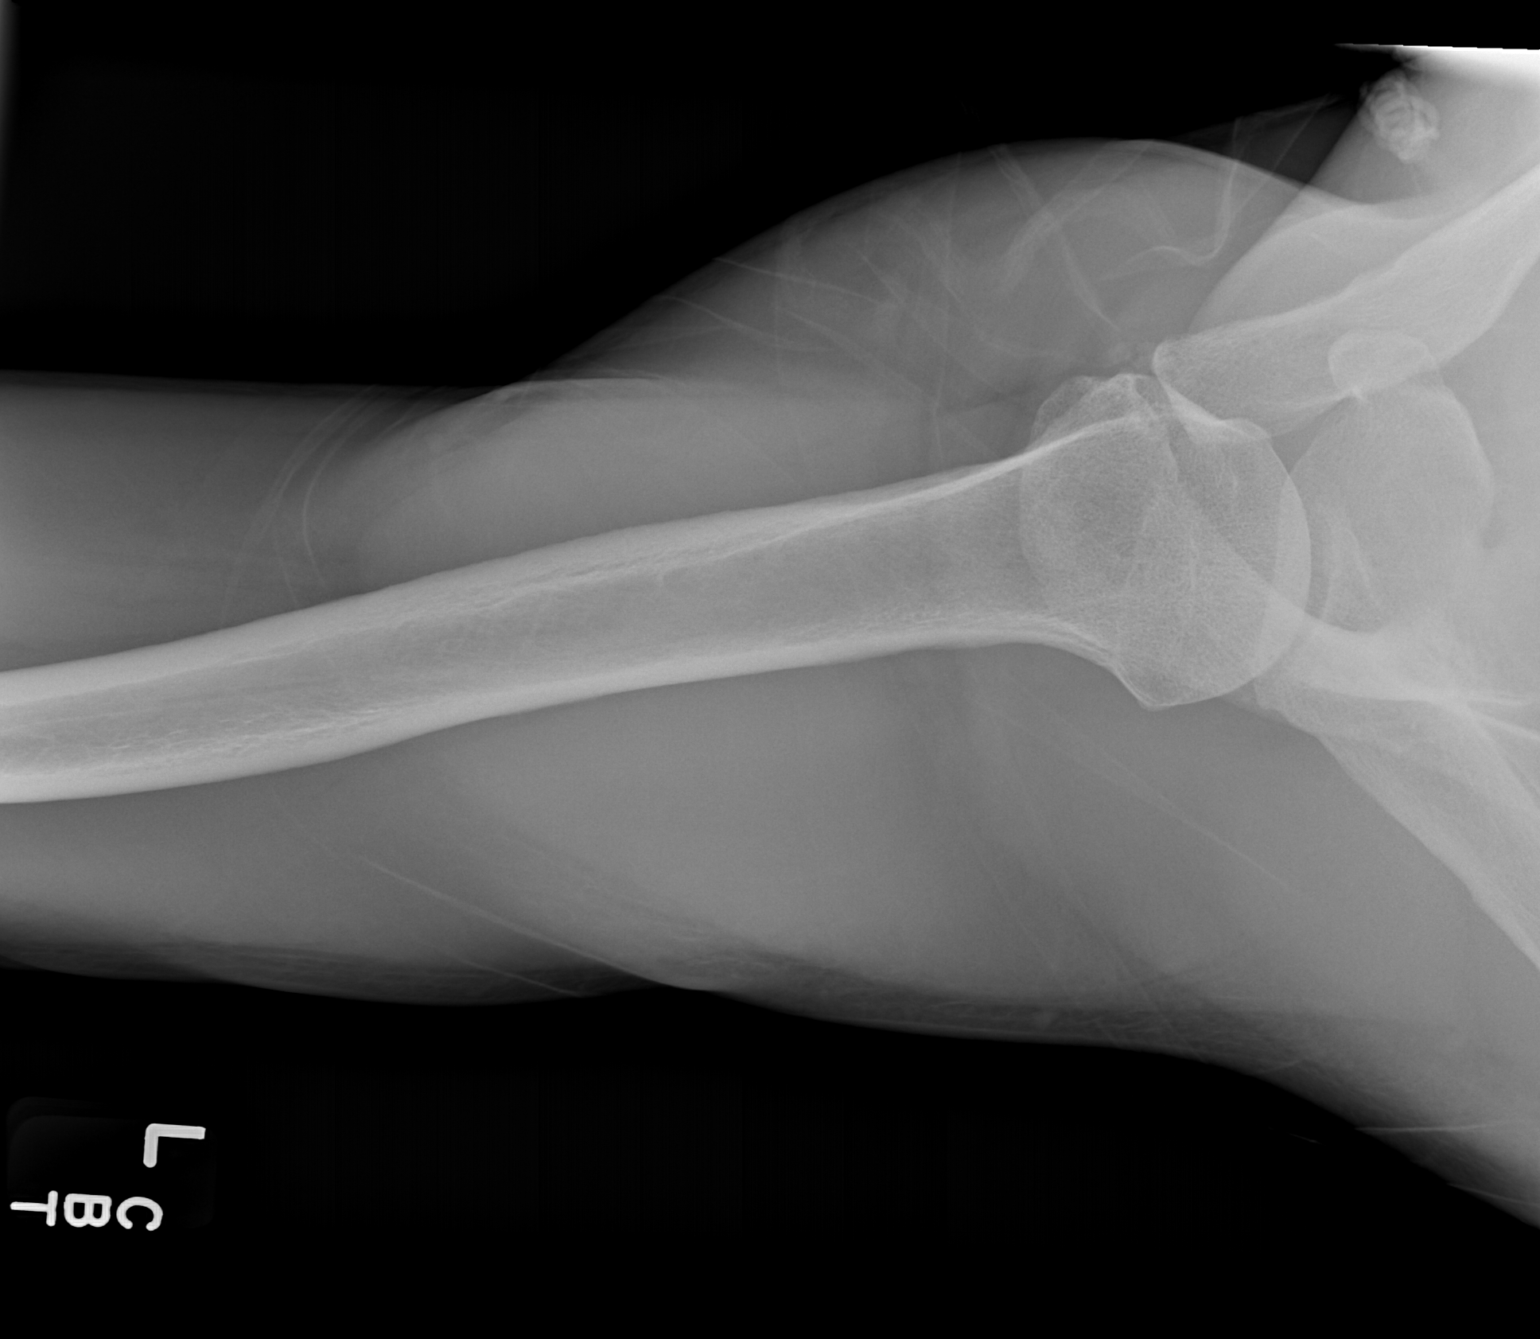

[3 of 3 positions shown; findings below may reference images not displayed]

FINDINGS: There is no evidence of fracture or dislocation. Normal alignment.
Minimal glenohumeral joint space narrowing. No significant
osteophytes. No erosions or avascular necrosis. No soft tissue
calcifications. Soft tissues are unremarkable. Left cervical rib
typically incidental.
IMPRESSION: No acute fracture or subluxation of the left shoulder.

## 2023-09-11 ENCOUNTER — Emergency Department (HOSPITAL_COMMUNITY)
Admission: EM | Admit: 2023-09-11 | Discharge: 2023-09-12 | Discharge status: Against Medical Advice | Payer: 59 | Attending: Emergency Medicine | Admitting: Emergency Medicine

## 2023-09-11 ENCOUNTER — Emergency Department (HOSPITAL_COMMUNITY): Payer: 59

## 2023-09-11 DIAGNOSIS — Z23 Encounter for immunization: Secondary | ICD-10-CM | POA: Diagnosis not present

## 2023-09-11 DIAGNOSIS — R079 Chest pain, unspecified: Secondary | ICD-10-CM | POA: Insufficient documentation

## 2023-09-11 DIAGNOSIS — S0990XA Unspecified injury of head, initial encounter: Secondary | ICD-10-CM | POA: Insufficient documentation

## 2023-09-11 DIAGNOSIS — Y92410 Unspecified street and highway as the place of occurrence of the external cause: Secondary | ICD-10-CM | POA: Diagnosis not present

## 2023-09-11 DIAGNOSIS — S3994XA Unspecified injury of external genitals, initial encounter: Secondary | ICD-10-CM

## 2023-09-11 DIAGNOSIS — S41112A Laceration without foreign body of left upper arm, initial encounter: Secondary | ICD-10-CM | POA: Insufficient documentation

## 2023-09-11 DIAGNOSIS — S4992XA Unspecified injury of left shoulder and upper arm, initial encounter: Secondary | ICD-10-CM | POA: Diagnosis present

## 2023-09-11 DIAGNOSIS — S3991XA Unspecified injury of abdomen, initial encounter: Secondary | ICD-10-CM | POA: Diagnosis not present

## 2023-09-11 DIAGNOSIS — S3021XA Contusion of penis, initial encounter: Secondary | ICD-10-CM | POA: Insufficient documentation

## 2023-09-11 DIAGNOSIS — Y908 Blood alcohol level of 240 mg/100 ml or more: Secondary | ICD-10-CM | POA: Diagnosis not present

## 2023-09-11 LAB — URINALYSIS, ROUTINE W REFLEX MICROSCOPIC
Bilirubin Urine: NEGATIVE
Glucose, UA: NEGATIVE mg/dL
Ketones, ur: NEGATIVE mg/dL
Leukocytes,Ua: NEGATIVE
Nitrite: NEGATIVE
Protein, ur: NEGATIVE mg/dL
RBC / HPF: >50 RBC/hpf (ref 0–5)
Specific Gravity, Urine: 1.011 (ref 1.005–1.030)
pH: 6 (ref 5.0–8.0)

## 2023-09-11 LAB — SAMPLE TO BLOOD BANK

## 2023-09-11 LAB — COMPREHENSIVE METABOLIC PANEL
ALT: 40 U/L (ref 0–44)
AST: 49 U/L — ABNORMAL HIGH (ref 15–41)
Albumin: 3.5 g/dL (ref 3.5–5.0)
Alkaline Phosphatase: 61 U/L (ref 38–126)
Anion gap: 14 (ref 5–15)
BUN: 6 mg/dL (ref 6–20)
CO2: 21 mmol/L — ABNORMAL LOW (ref 22–32)
Calcium: 8.4 mg/dL — ABNORMAL LOW (ref 8.9–10.3)
Chloride: 97 mmol/L — ABNORMAL LOW (ref 98–111)
Creatinine, Ser: 0.81 mg/dL (ref 0.61–1.24)
GFR, Estimated: >60 mL/min (ref >60)
Glucose, Bld: 114 mg/dL — ABNORMAL HIGH (ref 70–99)
Potassium: 3.6 mmol/L (ref 3.5–5.1)
Sodium: 132 mmol/L — ABNORMAL LOW (ref 135–145)
Total Bilirubin: 0.4 mg/dL (ref 0.3–1.2)
Total Protein: 6.3 g/dL — ABNORMAL LOW (ref 6.5–8.1)

## 2023-09-11 LAB — CBC
HCT: 49.6 % (ref 39.0–52.0)
Hemoglobin: 17.2 g/dL — ABNORMAL HIGH (ref 13.0–17.0)
MCH: 33.6 pg (ref 26.0–34.0)
MCHC: 34.7 g/dL (ref 30.0–36.0)
MCV: 96.9 fL (ref 80.0–100.0)
Platelets: 131 10*3/uL — ABNORMAL LOW (ref 150–400)
RBC: 5.12 MIL/uL (ref 4.22–5.81)
RDW: 15.5 % (ref 11.5–15.5)
WBC: 6.5 10*3/uL (ref 4.0–10.5)
nRBC: 0 % (ref 0.0–0.2)

## 2023-09-11 LAB — PROTIME-INR
INR: 1 (ref 0.8–1.2)
Prothrombin Time: 13.2 seconds (ref 11.4–15.2)

## 2023-09-11 LAB — I-STAT CHEM 8, ED
BUN: 6 mg/dL (ref 6–20)
Calcium, Ion: 0.95 mmol/L — ABNORMAL LOW (ref 1.15–1.40)
Chloride: 100 mmol/L (ref 98–111)
Creatinine, Ser: 1.1 mg/dL (ref 0.61–1.24)
Glucose, Bld: 112 mg/dL — ABNORMAL HIGH (ref 70–99)
HCT: 52 % (ref 39.0–52.0)
Hemoglobin: 17.7 g/dL — ABNORMAL HIGH (ref 13.0–17.0)
Potassium: 3.6 mmol/L (ref 3.5–5.1)
Sodium: 135 mmol/L (ref 135–145)
TCO2: 22 mmol/L (ref 22–32)

## 2023-09-11 LAB — I-STAT CG4 LACTIC ACID, ED: Lactic Acid, Venous: 2.8 mmol/L (ref 0.5–1.9)

## 2023-09-11 LAB — ETHANOL: Alcohol, Ethyl (B): 334 mg/dL (ref <10)

## 2023-09-11 MED ORDER — TETANUS-DIPHTH-ACELL PERTUSSIS 5-2.5-18.5 LF-MCG/0.5 IM SUSY
0.5000 mL | PREFILLED_SYRINGE | Freq: Once | INTRAMUSCULAR | Status: AC
  Administered 2023-09-11: 0.5 mL via INTRAMUSCULAR
  Filled 2023-09-11: qty 0.5

## 2023-09-11 MED ORDER — IOHEXOL 350 MG/ML SOLN
75.0000 mL | Freq: Once | INTRAVENOUS | Status: AC | PRN
  Administered 2023-09-11: 75 mL via INTRAVENOUS

## 2023-09-11 MED ORDER — SODIUM CHLORIDE 0.9 % IV BOLUS
1000.0000 mL | Freq: Once | INTRAVENOUS | Status: AC
  Administered 2023-09-11: 1000 mL via INTRAVENOUS

## 2023-09-11 MED ORDER — SODIUM CHLORIDE 0.9 % IV SOLN: INTRAVENOUS | Status: DC

## 2023-09-11 NOTE — Discharge Instructions (Signed)
Keep a dressing over the wound and apply antibiotic ointment.  Continue to wash the wound daily.  Call the urologist office to be checked to make sure your penile injury heals without any complications

## 2023-09-11 NOTE — Progress Notes (Signed)
Orthopedic Tech Progress Note Patient Details:  Robert Caldwell 1966-08-21 [id number]  Patient ID: [name], male   DOB: [birth year], 57 y.o.   MRN: [id number] I attended trauma page. Trinna Post 09/11/2023, 9:22 PM

## 2023-09-11 NOTE — ED Notes (Signed)
Pt declines to see urology consult for bleeding from penis, pt states "its bad enough as is, I don't want anything stuck up there." RN provided education to pt, pt elects to leave AMA at this time. Pt signed AMA form prior to leaving, finance signed as witness.
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
## 2023-09-11 NOTE — Progress Notes (Signed)
I met the pt in fluoroscopy for planned retrograde urethragram.  He reported to me that he has urinated a large volume without difficulty and did not think the test was needed.  I discussed this with Dr. Lynelle Doctor and Dr. Alvester Morin.  After further discussion with the patient and explaining that this test is the only way to rule out traumatic urethral injury, he again declined.  We discussed that if he were to experience urinary retention we may need to proceed in the near future and he is agreeable.    Please contact Radiology if needed.  Marliss Coots, MD Pager: [phone]

## 2023-09-11 NOTE — ED Notes (Signed)
Pt transported to CT by RN.
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
## 2023-09-12 NOTE — ED Notes (Signed)
Pt stood up to urinate and begin bleeding from penis again.  Gown soaked in blood.  MD notified.

## 2023-09-12 NOTE — ED Notes (Signed)
Pt refused urethrogram once arriving in IR.  Taken to [place]

## 2024-01-21 ENCOUNTER — Ambulatory Visit: Payer: 59 | Admitting: Internal Medicine

## 2024-02-01 ENCOUNTER — Encounter: Payer: Self-pay | Admitting: *Deleted

## 2024-04-17 ENCOUNTER — Encounter: Payer: 59 | Admitting: Nurse Practitioner

## 2024-04-21 ENCOUNTER — Encounter: Payer: 59 | Admitting: Nurse Practitioner

## 2024-07-21 DEATH — deceased
